# Patient Record
Sex: Female | Born: 1960 | Race: Black or African American | Hispanic: No | Marital: Single | Smoking: Never smoker
Health system: Southern US, Community
[De-identification: ages and names within clinical notes are randomized; demographics above are authoritative.]

## PROBLEM LIST (undated history)

## (undated) DIAGNOSIS — M549 Dorsalgia, unspecified: Secondary | ICD-10-CM

## (undated) DIAGNOSIS — M542 Cervicalgia: Secondary | ICD-10-CM

## (undated) DIAGNOSIS — I10 Essential (primary) hypertension: Secondary | ICD-10-CM

## (undated) DIAGNOSIS — E079 Disorder of thyroid, unspecified: Secondary | ICD-10-CM

## (undated) HISTORY — PX: NECK SURGERY: SHX720

## (undated) HISTORY — PX: ABDOMINAL HYSTERECTOMY: SHX81

## (undated) HISTORY — PX: ANKLE SURGERY: SHX546

## (undated) HISTORY — PX: KNEE SURGERY: SHX244

---

## 1998-03-20 ENCOUNTER — Encounter: Admission: RE | Admit: 1998-03-20 | Discharge: 1998-03-20 | Payer: Self-pay | Admitting: *Deleted

## 1998-04-01 ENCOUNTER — Inpatient Hospital Stay (HOSPITAL_COMMUNITY): Admission: AD | Admit: 1998-04-01 | Discharge: 1998-04-06 | Payer: Self-pay | Admitting: Obstetrics and Gynecology

## 1998-04-01 ENCOUNTER — Other Ambulatory Visit: Admission: RE | Admit: 1998-04-01 | Discharge: 1998-04-01 | Payer: Self-pay | Admitting: Obstetrics and Gynecology

## 1998-04-18 ENCOUNTER — Encounter (HOSPITAL_COMMUNITY): Admission: RE | Admit: 1998-04-18 | Discharge: 1998-07-17 | Payer: Self-pay | Admitting: Obstetrics and Gynecology

## 1998-11-25 ENCOUNTER — Emergency Department (HOSPITAL_COMMUNITY): Admission: EM | Admit: 1998-11-25 | Discharge: 1998-11-25 | Payer: Self-pay

## 1999-07-22 ENCOUNTER — Emergency Department (HOSPITAL_COMMUNITY): Admission: EM | Admit: 1999-07-22 | Discharge: 1999-07-22 | Payer: Self-pay | Admitting: Emergency Medicine

## 2004-10-30 ENCOUNTER — Other Ambulatory Visit: Admission: RE | Admit: 2004-10-30 | Discharge: 2004-10-30 | Payer: Self-pay | Admitting: Obstetrics and Gynecology

## 2006-11-23 ENCOUNTER — Other Ambulatory Visit: Admission: RE | Admit: 2006-11-23 | Discharge: 2006-11-23 | Payer: Self-pay | Admitting: Obstetrics & Gynecology

## 2010-01-13 ENCOUNTER — Ambulatory Visit (HOSPITAL_COMMUNITY): Admission: RE | Admit: 2010-01-13 | Discharge: 2010-01-13 | Payer: Self-pay | Admitting: Orthopaedic Surgery

## 2010-01-13 ENCOUNTER — Ambulatory Visit: Payer: Self-pay | Admitting: Surgery

## 2010-01-13 ENCOUNTER — Encounter (INDEPENDENT_AMBULATORY_CARE_PROVIDER_SITE_OTHER): Payer: Self-pay | Admitting: Orthopaedic Surgery

## 2010-02-20 ENCOUNTER — Ambulatory Visit: Payer: Self-pay | Admitting: Diagnostic Radiology

## 2010-02-20 ENCOUNTER — Emergency Department (HOSPITAL_BASED_OUTPATIENT_CLINIC_OR_DEPARTMENT_OTHER): Admission: EM | Admit: 2010-02-20 | Discharge: 2010-02-20 | Payer: Self-pay | Admitting: Emergency Medicine

## 2010-03-17 ENCOUNTER — Ambulatory Visit: Payer: Self-pay | Admitting: Diagnostic Radiology

## 2010-03-17 ENCOUNTER — Emergency Department (HOSPITAL_BASED_OUTPATIENT_CLINIC_OR_DEPARTMENT_OTHER): Admission: EM | Admit: 2010-03-17 | Discharge: 2010-03-17 | Payer: Self-pay | Admitting: Emergency Medicine

## 2010-04-17 ENCOUNTER — Emergency Department (HOSPITAL_BASED_OUTPATIENT_CLINIC_OR_DEPARTMENT_OTHER): Admission: EM | Admit: 2010-04-17 | Discharge: 2010-04-17 | Payer: Self-pay | Admitting: Emergency Medicine

## 2010-04-18 ENCOUNTER — Ambulatory Visit: Payer: Self-pay | Admitting: Vascular Surgery

## 2010-04-18 ENCOUNTER — Encounter (INDEPENDENT_AMBULATORY_CARE_PROVIDER_SITE_OTHER): Payer: Self-pay | Admitting: Emergency Medicine

## 2010-04-18 ENCOUNTER — Ambulatory Visit (HOSPITAL_COMMUNITY): Admission: RE | Admit: 2010-04-18 | Discharge: 2010-04-18 | Payer: Self-pay | Admitting: Emergency Medicine

## 2010-04-22 ENCOUNTER — Ambulatory Visit: Payer: Self-pay | Admitting: Hematology & Oncology

## 2010-04-28 LAB — CBC WITH DIFFERENTIAL (CANCER CENTER ONLY)
EOS%: 1.1 % (ref 0.0–7.0)
HCT: 38.5 % (ref 34.8–46.6)
HGB: 13.1 g/dL (ref 11.6–15.9)
LYMPH#: 3.4 10*3/uL — ABNORMAL HIGH (ref 0.9–3.3)
NEUT#: 4.7 10*3/uL (ref 1.5–6.5)
RDW: 12.7 % (ref 10.5–14.6)
WBC: 8.7 10*3/uL (ref 3.9–10.0)

## 2010-04-28 LAB — TECHNOLOGIST REVIEW CHCC SATELLITE

## 2010-04-28 LAB — PROTIME-INR (CHCC SATELLITE): Protime: 18 Seconds — ABNORMAL HIGH (ref 10.6–13.4)

## 2010-04-30 LAB — LUPUS ANTICOAGULANT PANEL
DRVVT 1:1 Mix: 42.4 secs (ref 36.2–44.3)
DRVVT: 54 secs — ABNORMAL HIGH (ref 36.2–44.3)
PTT Lupus Anticoagulant: 46.2 secs — ABNORMAL HIGH (ref 30.0–45.6)
PTTLA 4:1 Mix: 41.5 secs (ref 30.0–45.6)

## 2010-04-30 LAB — CARDIOLIPIN ANTIBODIES, IGG, IGM, IGA: Anticardiolipin IgA: 8 APL U/mL (ref <22)

## 2010-11-21 LAB — PROTIME-INR
INR: 1.4 (ref 0.00–1.49)
Prothrombin Time: 17.4 seconds — ABNORMAL HIGH (ref 11.6–15.2)

## 2010-11-23 LAB — CBC
Hemoglobin: 13.1 g/dL (ref 12.0–15.0)
Hemoglobin: 12.8 g/dL (ref 12.0–15.0)
MCV: 86.8 fL (ref 78.0–100.0)
RBC: 4.6 MIL/uL (ref 3.87–5.11)
RBC: 4.41 MIL/uL (ref 3.87–5.11)
RDW: 13.7 % (ref 11.5–15.5)

## 2010-11-23 LAB — DIFFERENTIAL
Basophils Relative: 0 % (ref 0–1)
Eosinophils Absolute: 0.1 10*3/uL (ref 0.0–0.7)
Eosinophils Relative: 1 % (ref 0–5)
Lymphocytes Relative: 37 % (ref 12–46)
Lymphocytes Relative: 34 % (ref 12–46)
Lymphs Abs: 2.7 10*3/uL (ref 0.7–4.0)
Monocytes Absolute: 0.6 10*3/uL (ref 0.1–1.0)
Monocytes Relative: 9 % (ref 3–12)

## 2010-11-23 LAB — POCT CARDIAC MARKERS
CKMB, poc: <1 ng/mL — ABNORMAL LOW (ref 1.0–8.0)
Myoglobin, poc: 49.2 ng/mL (ref 12–200)
Myoglobin, poc: 35.3 ng/mL (ref 12–200)
Troponin i, poc: <0.05 ng/mL (ref 0.00–0.09)

## 2010-11-23 LAB — GC/CHLAMYDIA PROBE AMP, GENITAL: Chlamydia, DNA Probe: NEGATIVE

## 2010-11-23 LAB — COMPREHENSIVE METABOLIC PANEL
ALT: 13 U/L (ref 0–35)
Albumin: 3.6 g/dL (ref 3.5–5.2)
Alkaline Phosphatase: 86 U/L (ref 39–117)
Creatinine, Ser: 0.9 mg/dL (ref 0.4–1.2)
GFR calc non Af Amer: >60 mL/min (ref >60)

## 2010-11-23 LAB — BASIC METABOLIC PANEL
Chloride: 108 mEq/L (ref 96–112)
GFR calc Af Amer: >60 mL/min (ref >60)

## 2010-11-23 LAB — WET PREP, GENITAL: Trich, Wet Prep: NONE SEEN

## 2010-11-23 LAB — LIPASE, BLOOD: Lipase: 34 U/L (ref 23–300)

## 2010-11-23 LAB — PROTIME-INR
INR: 1.99 — ABNORMAL HIGH (ref 0.00–1.49)
INR: 0.98 (ref 0.00–1.49)
Prothrombin Time: 22.4 seconds — ABNORMAL HIGH (ref 11.6–15.2)
Prothrombin Time: 12.9 seconds (ref 11.6–15.2)

## 2014-06-20 ENCOUNTER — Emergency Department (HOSPITAL_BASED_OUTPATIENT_CLINIC_OR_DEPARTMENT_OTHER)
Admission: EM | Admit: 2014-06-20 | Discharge: 2014-06-20 | Disposition: A | Discharge status: Home / Self Care | Payer: 59 | Attending: Emergency Medicine | Admitting: Emergency Medicine

## 2014-06-20 ENCOUNTER — Encounter (HOSPITAL_BASED_OUTPATIENT_CLINIC_OR_DEPARTMENT_OTHER): Payer: Self-pay | Admitting: Emergency Medicine

## 2014-06-20 DIAGNOSIS — Z79899 Other long term (current) drug therapy: Secondary | ICD-10-CM | POA: Diagnosis not present

## 2014-06-20 DIAGNOSIS — M2550 Pain in unspecified joint: Secondary | ICD-10-CM | POA: Insufficient documentation

## 2014-06-20 DIAGNOSIS — I10 Essential (primary) hypertension: Secondary | ICD-10-CM | POA: Insufficient documentation

## 2014-06-20 DIAGNOSIS — M546 Pain in thoracic spine: Secondary | ICD-10-CM | POA: Diagnosis not present

## 2014-06-20 DIAGNOSIS — E079 Disorder of thyroid, unspecified: Secondary | ICD-10-CM | POA: Diagnosis not present

## 2014-06-20 DIAGNOSIS — M542 Cervicalgia: Secondary | ICD-10-CM | POA: Insufficient documentation

## 2014-06-20 HISTORY — DX: Dorsalgia, unspecified: M54.9

## 2014-06-20 HISTORY — DX: Essential (primary) hypertension: I10

## 2014-06-20 HISTORY — DX: Disorder of thyroid, unspecified: E07.9

## 2014-06-20 HISTORY — DX: Cervicalgia: M54.2

## 2014-06-20 MED ORDER — NAPROXEN 500 MG PO TABS: 500.0000 mg | ORAL_TABLET | Freq: Two times a day (BID) | ORAL | Status: DC

## 2014-06-20 MED ORDER — METHOCARBAMOL 500 MG PO TABS: 500.0000 mg | ORAL_TABLET | Freq: Two times a day (BID) | ORAL | Status: DC

## 2014-06-20 MED ORDER — HYDROCODONE-ACETAMINOPHEN 5-325 MG PO TABS: 2.0000 | ORAL_TABLET | ORAL | Status: DC | PRN

## 2014-06-20 NOTE — Discharge Instructions (Signed)
Use medications as prescribed.  Call Dr. Darrelyn HillockGioffre for recheck appointment.  Return to ER, or your PCP with any loss of strength in Lt arm, or with any weakness/loss of feeling in right arm or either leg.

## 2014-06-20 NOTE — ED Notes (Signed)
Fell at work Dec 2014-c/o increase in pain to back of neck and left shoulder and arm-steady gait into triage

## 2014-06-20 NOTE — ED Provider Notes (Signed)
CSN: [id number]     Arrival date & time 06/20/14  1445 History   First MD Initiated Contact with Patient 06/20/14 1517     Chief Complaint  Patient presents with  . Neck Pain      HPI  Patient presents for evaluation of neck and back pain. Relates her symptoms to follow work in 2014. [facility]ioffre [facility] had an MRI. Was told that it did not require surgery. Continues to have episodes of pain at work. 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 No weakness to the arm. Simple pain and neck, radiates to the head and an arm. States his "the whole arm and all the fingers". No gait problems. No lower extremity symptoms. No right extremity symptoms.  Past Medical History  Diagnosis Date  . Back pain   . Neck pain   . Hypertension   . Thyroid disease    Past Surgical History  Procedure Laterality Date  . Abdominal hysterectomy    . Ankle surgery     No family history on file. History  Substance Use Topics  . Smoking status: Never Smoker   . Smokeless tobacco: Not on file  . Alcohol Use: No   OB History   Grav Para Term Preterm Abortions TAB SAB Ect Mult Living                 Review of Systems  Constitutional: Negative for fever, chills, diaphoresis, appetite change and fatigue.  HENT: Negative for mouth sores, sore throat and trouble swallowing.   Eyes: Negative for visual disturbance.  Respiratory: Negative for cough, chest tightness, shortness of breath and wheezing.   Cardiovascular: Negative for chest pain.  Gastrointestinal: Negative for nausea, vomiting, abdominal pain, diarrhea and abdominal distention.  Endocrine: Negative for polydipsia, polyphagia and polyuria.  Genitourinary: Negative for dysuria, frequency and hematuria.  Musculoskeletal: Positive for arthralgias, back pain, myalgias and neck pain. Negative for gait problem.  Skin: Negative for color change, pallor and rash.  Neurological: Negative for dizziness, syncope, light-headedness  and headaches.  Hematological: Does not bruise/bleed easily.  Psychiatric/Behavioral: Negative for behavioral problems and confusion.      Allergies  Review of patient's allergies indicates no known allergies.  Home Medications   Prior to Admission medications   Medication Sig Start Date End Date Taking? Authorizing Provider  Levothyroxine Sodium (SYNTHROID PO) Take by mouth.   Yes Historical Provider, MD  TRAMADOL HCL PO Take by mouth.   Yes Historical Provider, MD  Valsartan (DIOVAN PO) Take by mouth.   Yes Historical Provider, MD  HYDROcodone-acetaminophen (NORCO/VICODIN) 5-325 MG per tablet Take 2 tablets by mouth every 4 (four) hours as needed. 06/20/14   Rolland PorterMark Merrilee Ancona, MD  methocarbamol (ROBAXIN) 500 MG tablet Take 1 tablet (500 mg total) by mouth 2 (two) times daily. 06/20/14   Rolland PorterMark Eadie Repetto, MD  naproxen (NAPROSYN) 500 MG tablet Take 1 tablet (500 mg total) by mouth 2 (two) times daily. 06/20/14   Rolland PorterMark Breland Elders, MD   BP 180/109  Pulse 86  Temp(Src) 98.1 F (36.7 C) (Oral)  Resp 16  Ht 5\' 7"  (1.702 m)  Wt 290 lb (131.543 kg)  BMI 45.41 kg/m2  SpO2 100% Physical Exam  Constitutional: She is oriented to person, place, and time. She appears well-developed and well-nourished. No distress.  HENT:  Head: Normocephalic.    Eyes: Conjunctivae are normal. Pupils are equal, round, and reactive to light. No scleral icterus.  Neck: Normal range of motion. Neck supple. No  thyromegaly present.  Cardiovascular: Normal rate and regular rhythm.  Exam reveals no gallop and no friction rub.   No murmur heard. Pulmonary/Chest: Effort normal and breath sounds normal. No respiratory distress. She has no wheezes. She has no rales.  Abdominal: Soft. Bowel sounds are normal. She exhibits no distension. There is no tenderness. There is no rebound.  Musculoskeletal: Normal range of motion.       Back:  Neurological: She is alert and oriented to person, place, and time.  Normal symmetric Strength to  shoulder shrug, triceps, biceps, grip,wrist flex/extend,and intrinsics  Norma lsymmetric sensation above and below clavicles, and to all distributions to UEs. Norma symmetric strength to flex/.extend hip and knees, dorsi/plantar flex ankles. Normal symmetric sensation to all distributions to LEs Patellar and achilles reflexes 1-2+. Downgoing Babinski   Skin: Skin is warm and dry. No rash noted.  Psychiatric: She has a normal mood and affect. Her behavior is normal.    ED Course  Procedures (including critical care time) Labs Review Labs Reviewed - No data to display  Imaging Review No results found.   EKG Interpretation None      MDM   Final diagnoses:  Neck pain    No symptoms, or physical exam findings that would indicate acute herniated nucleus. Plan is back to her primary care physician or orthopedist. Consideration for physical therapy. Return precautions given.    Rolland PorterMark Sharryn Belding, MD 06/20/14 [phone]

## 2014-08-10 ENCOUNTER — Emergency Department (HOSPITAL_BASED_OUTPATIENT_CLINIC_OR_DEPARTMENT_OTHER)
Admission: EM | Admit: 2014-08-10 | Discharge: 2014-08-10 | Disposition: A | Discharge status: Home / Self Care | Payer: 59 | Attending: Emergency Medicine | Admitting: Emergency Medicine

## 2014-08-10 ENCOUNTER — Encounter (HOSPITAL_BASED_OUTPATIENT_CLINIC_OR_DEPARTMENT_OTHER): Payer: Self-pay

## 2014-08-10 ENCOUNTER — Emergency Department (HOSPITAL_BASED_OUTPATIENT_CLINIC_OR_DEPARTMENT_OTHER): Payer: 59

## 2014-08-10 DIAGNOSIS — R51 Headache: Secondary | ICD-10-CM | POA: Insufficient documentation

## 2014-08-10 DIAGNOSIS — R05 Cough: Secondary | ICD-10-CM | POA: Diagnosis present

## 2014-08-10 DIAGNOSIS — Z791 Long term (current) use of non-steroidal anti-inflammatories (NSAID): Secondary | ICD-10-CM | POA: Insufficient documentation

## 2014-08-10 DIAGNOSIS — M549 Dorsalgia, unspecified: Secondary | ICD-10-CM | POA: Diagnosis not present

## 2014-08-10 DIAGNOSIS — E079 Disorder of thyroid, unspecified: Secondary | ICD-10-CM | POA: Insufficient documentation

## 2014-08-10 DIAGNOSIS — J069 Acute upper respiratory infection, unspecified: Secondary | ICD-10-CM | POA: Insufficient documentation

## 2014-08-10 DIAGNOSIS — I10 Essential (primary) hypertension: Secondary | ICD-10-CM | POA: Diagnosis not present

## 2014-08-10 DIAGNOSIS — R059 Cough, unspecified: Secondary | ICD-10-CM

## 2014-08-10 DIAGNOSIS — Z79899 Other long term (current) drug therapy: Secondary | ICD-10-CM | POA: Diagnosis not present

## 2014-08-10 MED ORDER — DM-GUAIFENESIN ER 30-600 MG PO TB12: 1.0000 | ORAL_TABLET | Freq: Two times a day (BID) | ORAL | Status: DC

## 2014-08-10 MED ORDER — NAPROXEN 500 MG PO TABS: 500.0000 mg | ORAL_TABLET | Freq: Two times a day (BID) | ORAL | Status: DC

## 2014-08-10 NOTE — Discharge Instructions (Signed)
Rest and drink plenty of fluids. Take the Naprosyn as needed for her of body aches and soreness. Take Mucinex DM for the cough and phlegm. Today's chest x-ray negative for pneumonia. Work note provided for the next several days. Return for any new or worse symptoms.

## 2014-08-10 NOTE — ED Provider Notes (Signed)
CSN: [id number]     Arrival date & time 08/10/14  1734 History   This chart was scribed for Vanetta MuldersScott Nioma Mccubbins, MD by Abel PrestoKara Demonbreun, ED Scribe. This patient was seen in room MH12/MH12 and the patient's care was started at 7:01 PM.     Chief Complaint  Patient presents with  . Cough    The history is provided by the patient. No language interpreter was used.    HPI Comments: Jeanette Dodson is a 53 y.o. female who presents to the Emergency Department complaining of a productive cough with onset 2 days ago.  Pt notes associated congestion, rhinorrhea, chills, headache, sore throat, and ear pain.  Pt notes she has not taken her temperature but denies feeling febrile. Pt denies nausea, vomiting,  diarrhea, and rash. Pt states she has PMHx of back pain.  Pt sees Dr. Annie MainVaradarjan.    Past Medical History  Diagnosis Date  . Back pain   . Neck pain   . Hypertension   . Thyroid disease    Past Surgical History  Procedure Laterality Date  . Abdominal hysterectomy    . Ankle surgery     No family history on file. History  Substance Use Topics  . Smoking status: Never Smoker   . Smokeless tobacco: Not on file  . Alcohol Use: No   OB History    No data available     Review of Systems  Constitutional: Positive for chills. Negative for fever.  HENT: Positive for congestion, nosebleeds, rhinorrhea and sore throat.   Eyes: Negative for visual disturbance.  Respiratory: Positive for cough. Negative for shortness of breath.   Cardiovascular: Negative for chest pain and leg swelling.  Gastrointestinal: Negative for nausea, vomiting, abdominal pain and diarrhea.  Genitourinary: Negative for dysuria.  Musculoskeletal: Positive for back pain.  Skin: Negative for rash.  Neurological: Positive for headaches.  Hematological: Does not bruise/bleed easily.  Psychiatric/Behavioral: Negative for confusion.      Allergies  Review of patient's allergies indicates no known allergies.  Home  Medications   Prior to Admission medications   Medication Sig Start Date End Date Taking? Authorizing Provider  dextromethorphan-guaiFENesin (MUCINEX DM) 30-600 MG per 12 hr tablet Take 1 tablet by mouth 2 (two) times daily. 08/10/14   Vanetta MuldersScott Tadd Holtmeyer, MD  HYDROcodone-acetaminophen (NORCO/VICODIN) 5-325 MG per tablet Take 2 tablets by mouth every 4 (four) hours as needed. 06/20/14   Rolland PorterMark James, MD  Levothyroxine Sodium (SYNTHROID PO) Take by mouth.    Historical Provider, MD  methocarbamol (ROBAXIN) 500 MG tablet Take 1 tablet (500 mg total) by mouth 2 (two) times daily. 06/20/14   Rolland PorterMark James, MD  naproxen (NAPROSYN) 500 MG tablet Take 1 tablet (500 mg total) by mouth 2 (two) times daily. 08/10/14   Vanetta MuldersScott Gerrad Welker, MD  TRAMADOL HCL PO Take by mouth.    Historical Provider, MD  Valsartan (DIOVAN PO) Take by mouth.    Historical Provider, MD   BP 163/97 mmHg  Pulse 73  Temp(Src) 97.9 F (36.6 C) (Oral)  Resp 18  Ht 5\' 7"  (1.702 m)  Wt 260 lb (117.935 kg)  BMI 40.71 kg/m2  SpO2 100% Physical Exam  Constitutional: She is oriented to person, place, and time. She appears well-developed and well-nourished.  HENT:  Head: Normocephalic.  Right Ear: Tympanic membrane normal.  Left Ear: Tympanic membrane normal.  Mouth/Throat: Oropharynx is clear and moist. No oropharyngeal exudate.  Eyes: Conjunctivae and EOM are normal. Pupils are equal, round, and reactive  to light. No scleral icterus.  Neck: Normal range of motion. Neck supple.  Cardiovascular: Normal rate, regular rhythm and normal heart sounds.   No murmur heard. Pulmonary/Chest: Effort normal and breath sounds normal. No respiratory distress. She has no wheezes. She has no rales.  Abdominal: Soft. Bowel sounds are normal. There is no tenderness.  Musculoskeletal: Normal range of motion. She exhibits no edema.  Neurological: She is alert and oriented to person, place, and time. No cranial nerve deficit. She exhibits normal muscle tone.  Coordination normal.  Skin: Skin is warm and dry.  Psychiatric: She has a normal mood and affect. Her behavior is normal.  Nursing note and vitals reviewed.   ED Course  Procedures (including critical care time) DIAGNOSTIC STUDIES: Oxygen Saturation is 100% on room air, normal by my interpretation.    COORDINATION OF CARE: 7:10 PM Discussed treatment plan with patient at beside, the patient agrees with the plan and has no further questions at this time.   Labs Review Labs Reviewed - No data to display  Imaging Review Dg Chest 2 View  08/10/2014   CLINICAL DATA:  Initial evaluation for productive cough for 2 days with chills headaches sore throat and ear pain  EXAM: CHEST  2 VIEW  COMPARISON:  None.  FINDINGS: The heart size and mediastinal contours are within normal limits. Both lungs are clear. The visualized skeletal structures are unremarkable.  IMPRESSION: No active cardiopulmonary disease.   Electronically Signed   By: Esperanza Heiraymond  Rubner M.D.   On: 08/10/2014 20:08     EKG Interpretation None      MDM   Final diagnoses:  Cough  URI (upper respiratory infection)   Patient nontoxic no acute distress. No hypoxia. Room air saturations 100%. Chest x-ray negative for pneumonia. Symptoms consistent with flulike illness or upper respiratory infection. Will treat with Mucinex DM and Naprosyn. Work note provided.    I personally performed the services described in this documentation, which was scribed in my presence. The recorded information has been reviewed and is accurate.       Vanetta MuldersScott Cecily Lawhorne, MD 08/10/14 2040

## 2014-08-10 NOTE — ED Notes (Signed)
Pt reports cough, congestion, postnasal drip and sore throat.  Denies fever but reports chills.

## 2014-08-20 ENCOUNTER — Emergency Department (HOSPITAL_COMMUNITY)
Admission: EM | Admit: 2014-08-20 | Discharge: 2014-08-20 | Disposition: A | Discharge status: Home / Self Care | Payer: 59 | Attending: Emergency Medicine | Admitting: Emergency Medicine

## 2014-08-20 ENCOUNTER — Encounter (HOSPITAL_COMMUNITY): Payer: Self-pay | Admitting: Emergency Medicine

## 2014-08-20 DIAGNOSIS — I10 Essential (primary) hypertension: Secondary | ICD-10-CM | POA: Insufficient documentation

## 2014-08-20 DIAGNOSIS — G8929 Other chronic pain: Secondary | ICD-10-CM | POA: Diagnosis not present

## 2014-08-20 DIAGNOSIS — M5431 Sciatica, right side: Secondary | ICD-10-CM | POA: Diagnosis not present

## 2014-08-20 DIAGNOSIS — M545 Low back pain: Secondary | ICD-10-CM | POA: Diagnosis present

## 2014-08-20 DIAGNOSIS — Z79899 Other long term (current) drug therapy: Secondary | ICD-10-CM | POA: Diagnosis not present

## 2014-08-20 DIAGNOSIS — E079 Disorder of thyroid, unspecified: Secondary | ICD-10-CM | POA: Diagnosis not present

## 2014-08-20 DIAGNOSIS — Z791 Long term (current) use of non-steroidal anti-inflammatories (NSAID): Secondary | ICD-10-CM | POA: Diagnosis not present

## 2014-08-20 DIAGNOSIS — Z9181 History of falling: Secondary | ICD-10-CM | POA: Diagnosis not present

## 2014-08-20 MED ORDER — IBUPROFEN 600 MG PO TABS: 600.0000 mg | ORAL_TABLET | Freq: Four times a day (QID) | ORAL | Status: DC | PRN

## 2014-08-20 MED ORDER — CYCLOBENZAPRINE HCL 10 MG PO TABS: 10.0000 mg | ORAL_TABLET | Freq: Two times a day (BID) | ORAL | Status: DC | PRN

## 2014-08-20 NOTE — ED Notes (Signed)
Patient states had a fall at work last year and had been [facility] for same until 2 weeks ago when she was referred to pain management.   Patient states is supposed to have injections on Wed by pain management, but has started having pain that radiates down leg at this time.   Patient did not call [facility].

## 2014-08-20 NOTE — ED Notes (Signed)
Pts vital signs updated pt awaiting discharge paperwork at bedside.  

## 2014-08-20 NOTE — Discharge Instructions (Signed)
Radicular Pain °Radicular pain in either the arm or leg is usually from a bulging or herniated disk in the spine. A piece of the herniated disk may press against the nerves as the nerves exit the spine. This causes pain which is felt at the tips of the nerves down the arm or leg. Other causes of radicular pain may include: °· Fractures. °· Heart disease. °· Cancer. °· An abnormal and usually degenerative state of the nervous system or nerves (neuropathy). °Diagnosis may require CT or MRI scanning to determine the primary cause.  °Nerves that start at the neck (nerve roots) may cause radicular pain in the outer shoulder and arm. It can spread down to the thumb and fingers. The symptoms vary depending on which nerve root has been affected. In most cases radicular pain improves with conservative treatment. Neck problems may require physical therapy, a neck collar, or cervical traction. Treatment may take many weeks, and surgery may be considered if the symptoms do not improve.  °Conservative treatment is also recommended for sciatica. Sciatica causes pain to radiate from the lower back or buttock area down the leg into the foot. Often there is a history of back problems. Most patients with sciatica are better after 2 to 4 weeks of rest and other supportive care. Short term bed rest can reduce the disk pressure considerably. Sitting, however, is not a good position since this increases the pressure on the disk. You should avoid bending, lifting, and all other activities which make the problem worse. Traction can be used in severe cases. Surgery is usually reserved for patients who do not improve within the first months of treatment. °Only take over-the-counter or prescription medicines for pain, discomfort, or fever as directed by your caregiver. Narcotics and muscle relaxants may help by relieving more severe pain and spasm and by providing mild sedation. Cold or massage can give significant relief. Spinal manipulation  is not recommended. It can increase the degree of disc protrusion. Epidural steroid injections are often effective treatment for radicular pain. These injections deliver medicine to the spinal nerve in the space between the protective covering of the spinal cord and back bones (vertebrae). Your caregiver can give you more information about steroid injections. These injections are most effective when given within two weeks of the onset of pain.  °You should see your caregiver for follow up care as recommended. A program for neck and back injury rehabilitation with stretching and strengthening exercises is an important part of management.  °SEEK IMMEDIATE MEDICAL CARE IF: °· You develop increased pain, weakness, or numbness in your arm or leg. °· You develop difficulty with bladder or bowel control. °· You develop abdominal pain. °Document Released: 10/01/2004 Document Revised: 11/16/2011 Document Reviewed: 12/17/2008 °ExitCare® Patient Information ©2015 [employer]. This information is not intended to replace advice given to you by your health care provider. Make sure you discuss any questions you have with your health care provider. ° °Back Exercises °Back exercises help treat and prevent back injuries. The goal of back exercises is to increase the strength of your abdominal and back muscles and the flexibility of your back. These exercises should be started when you no longer have back pain. Back exercises include: °· Pelvic Tilt. Lie on your back with your knees bent. Tilt your pelvis until the lower part of your back is against the floor. Hold this position 5 to 10 sec and repeat 5 to 10 times. °· Knee to Chest. Pull first 1 knee up   against your chest and hold for 20 to 30 seconds, repeat this with the other knee, and then both knees. This may be done with the other leg straight or bent, whichever feels better. °· Sit-Ups or Curl-Ups. Bend your knees 90 degrees. Start with tilting your pelvis, and do a partial,  slow sit-up, lifting your trunk only 30 to 45 degrees off the floor. Take at least 2 to 3 seconds for each sit-up. Do not do sit-ups with your knees out straight. If partial sit-ups are difficult, simply do the above but with only tightening your abdominal muscles and holding it as directed. °· Hip-Lift. Lie on your back with your knees flexed 90 degrees. Push down with your feet and shoulders as you raise your hips a couple inches off the floor; hold for 10 seconds, repeat 5 to 10 times. °· Back arches. Lie on your stomach, propping yourself up on bent elbows. Slowly press on your hands, causing an arch in your low back. Repeat 3 to 5 times. Any initial stiffness and discomfort should lessen with repetition over time. °· Shoulder-Lifts. Lie face down with arms beside your body. Keep hips and torso pressed to floor as you slowly lift your head and shoulders off the floor. °Do not overdo your exercises, especially in the beginning. Exercises may cause you some mild back discomfort which lasts for a few minutes; however, if the pain is more severe, or lasts for more than 15 minutes, do not continue exercises until you see your caregiver. Improvement with exercise therapy for back problems is slow.  °See your caregivers for assistance with developing a proper back exercise program. °Document Released: 10/01/2004 Document Revised: 11/16/2011 Document Reviewed: 06/25/2011 °ExitCare® Patient Information ©2015 [employer]. This information is not intended to replace advice given to you by your health care provider. Make sure you discuss any questions you have with your health care provider. ° °

## 2014-08-20 NOTE — ED Provider Notes (Signed)
CSN: [id number]     Arrival date & time 08/20/14  0929 History  This chart was scribed for non-physician practitioner Jinny SandersJoseph Audine Mangione, PA-C, working with Vida RollerBrian D Miller, MD by Littie Deedsichard Sun, ED Scribe. This patient was seen in room TR08C/TR08C and the patient's care was started at 11:30 AM.     Chief Complaint  Patient presents with  . Back Pain    The history is provided by the patient. No language interpreter was used.   HPI Comments: Jeanette Dodson is a 53 y.o. female with a hx of back pain and a fall last year who presents to the Emergency Department complaining of lower back pain radiating to her right leg that began yesterday afternoon. She has difficulty lifting her right leg and difficulty ambulating due to pain, however denies weakness in her leg. Patient has tried Tylenol and Aleve but with no relief; she has not tried any muscle relaxer. She denies hx of CA or IV drug use. Patient is able to control her bowel and bladder. Patient denies saddle anesthesia. She is supposed to have injections in 2 days by pain management. She had been seeing [facility] for a fall at work last year since the fall up until 2 weeks ago, when she was referred to pain management. Patient states her pain today is consistent with her chronic pain, however she has not experienced the severity of pain that radiates down her leg in the past.  Past Medical History  Diagnosis Date  . Back pain   . Neck pain   . Hypertension   . Thyroid disease    Past Surgical History  Procedure Laterality Date  . Abdominal hysterectomy    . Ankle surgery     No family history on file. History  Substance Use Topics  . Smoking status: Never Smoker   . Smokeless tobacco: Not on file  . Alcohol Use: No   OB History    No data available     Review of Systems  Genitourinary: Negative for dysuria, urgency, frequency, hematuria, decreased urine volume and difficulty urinating.  Musculoskeletal: Positive for myalgias and  back pain.      Allergies  Review of patient's allergies indicates no known allergies.  Home Medications   Prior to Admission medications   Medication Sig Start Date End Date Taking? Authorizing Provider  cyclobenzaprine (FLEXERIL) 10 MG tablet Take 1 tablet (10 mg total) by mouth 2 (two) times daily as needed for muscle spasms. 08/20/14   Monte FantasiaJoseph W Lavin Petteway, PA-C  dextromethorphan-guaiFENesin [facility](MUCINEX DM) 30-600 MG per 12 hr tablet Take 1 tablet by mouth 2 (two) times daily. 08/10/14   Vanetta MuldersScott Zackowski, MD  HYDROcodone-acetaminophen (NORCO/VICODIN) 5-325 MG per tablet Take 2 tablets by mouth every 4 (four) hours as needed. 06/20/14   Rolland PorterMark James, MD  ibuprofen (ADVIL,MOTRIN) 600 MG tablet Take 1 tablet (600 mg total) by mouth every 6 (six) hours as needed. 08/20/14   Monte FantasiaJoseph W Cline Draheim, PA-C  Levothyroxine Sodium (SYNTHROID PO) Take by mouth.    Historical Provider, MD  methocarbamol (ROBAXIN) 500 MG tablet Take 1 tablet (500 mg total) by mouth 2 (two) times daily. 06/20/14   Rolland PorterMark James, MD  naproxen (NAPROSYN) 500 MG tablet Take 1 tablet (500 mg total) by mouth 2 (two) times daily. 08/10/14   Vanetta MuldersScott Zackowski, MD  TRAMADOL HCL PO Take by mouth.    Historical Provider, MD  Valsartan (DIOVAN PO) Take by mouth.    Historical Provider, MD   BP  150/99 mmHg  Pulse 73  Temp(Src) 98.2 F (36.8 C) (Oral)  Resp 18  SpO2 99% Physical Exam  Constitutional: She is oriented to person, place, and time. She appears well-developed and well-nourished. No distress.  HENT:  Head: Normocephalic and atraumatic.  Mouth/Throat: Oropharynx is clear and moist. No oropharyngeal exudate.  Eyes: Pupils are equal, round, and reactive to light.  Neck: Neck supple.  Cardiovascular: Normal rate.   Pulmonary/Chest: Effort normal.  Musculoskeletal: She exhibits tenderness. She exhibits no edema.  Pain in right SI joint with palpation. Pain in L3-S1 region to palpation of spinous and paraspinous processes.  Neurological:  She is alert and oriented to person, place, and time. She has normal strength. No cranial nerve deficit or sensory deficit. She displays a negative Romberg sign. Gait normal. GCS eye subscore is 4. GCS verbal subscore is 5. GCS motor subscore is 6.  Patient fully alert answering questions appropriately in full, clear sentences. Motor strength 5 out of 5 in all major muscle groups of upper and lower extremities. Cranial nerves II through XII grossly intact. Distal sensation intact. Gait assessed and normal.  Skin: Skin is warm and dry. No rash noted.  Psychiatric: She has a normal mood and affect. Her behavior is normal.  Nursing note and vitals reviewed.   ED Course  Procedures  DIAGNOSTIC STUDIES: Oxygen Saturation is 98% on room air, normal by my interpretation.    COORDINATION OF CARE: 11:36 AM-Discussed treatment plan which includes muscle relaxer and pain medication with pt at bedside and pt agreed to plan.    Labs Review Labs Reviewed - No data to display  Imaging Review No results found.   EKG Interpretation None      MDM   Final diagnoses:  Sciatic nerve pain, right   Patient with back pain consistent with chronic pain with no new or concerning symptoms.  No neurological deficits and normal neuro exam.  Patient can walk but states is painful.  No loss of bowel or bladder control.  No concern for cauda equina.  No fever, night sweats, weight loss, h/o cancer, IVDU.  RICE protocol and pain medicine indicated and discussed with patient. Patient states she is following up with pain clinic in 2 days. I will prescribe conservative therapy for patient, recommending use of back exercises, heating pad, Motrin, Flexeril to avoid altering therapy plan of patient's pain management clinic. I discussed return precautions with patient and she was agreeable to this plan. I encouraged her to call or return to ER should she have any questions or concerns.  I personally performed the services  described in this documentation, which was scribed in my presence. The recorded information has been reviewed and is accurate.  BP 150/99 mmHg  Pulse 73  Temp(Src) 98.2 F (36.8 C) (Oral)  Resp 18  SpO2 99%  Signed,  Ladona MowJoe Bali Lyn, PA-C 6:30 PM     Monte FantasiaJoseph W Agata Lucente, PA-C 08/20/14 1832  Vida RollerBrian D Miller, MD 08/21/14 1600

## 2015-02-22 ENCOUNTER — Emergency Department (HOSPITAL_BASED_OUTPATIENT_CLINIC_OR_DEPARTMENT_OTHER)
Admission: EM | Admit: 2015-02-22 | Discharge: 2015-02-22 | Disposition: A | Discharge status: Home / Self Care | Payer: 59 | Attending: Emergency Medicine | Admitting: Emergency Medicine

## 2015-02-22 ENCOUNTER — Encounter (HOSPITAL_BASED_OUTPATIENT_CLINIC_OR_DEPARTMENT_OTHER): Payer: Self-pay

## 2015-02-22 ENCOUNTER — Emergency Department (HOSPITAL_BASED_OUTPATIENT_CLINIC_OR_DEPARTMENT_OTHER): Payer: 59

## 2015-02-22 DIAGNOSIS — X58XXXA Exposure to other specified factors, initial encounter: Secondary | ICD-10-CM | POA: Insufficient documentation

## 2015-02-22 DIAGNOSIS — Z79899 Other long term (current) drug therapy: Secondary | ICD-10-CM | POA: Insufficient documentation

## 2015-02-22 DIAGNOSIS — Y9289 Other specified places as the place of occurrence of the external cause: Secondary | ICD-10-CM | POA: Insufficient documentation

## 2015-02-22 DIAGNOSIS — Y998 Other external cause status: Secondary | ICD-10-CM | POA: Diagnosis not present

## 2015-02-22 DIAGNOSIS — Y9389 Activity, other specified: Secondary | ICD-10-CM | POA: Insufficient documentation

## 2015-02-22 DIAGNOSIS — S8992XA Unspecified injury of left lower leg, initial encounter: Secondary | ICD-10-CM | POA: Insufficient documentation

## 2015-02-22 DIAGNOSIS — I10 Essential (primary) hypertension: Secondary | ICD-10-CM | POA: Insufficient documentation

## 2015-02-22 DIAGNOSIS — M25562 Pain in left knee: Secondary | ICD-10-CM

## 2015-02-22 DIAGNOSIS — E079 Disorder of thyroid, unspecified: Secondary | ICD-10-CM | POA: Diagnosis not present

## 2015-02-22 DIAGNOSIS — E663 Overweight: Secondary | ICD-10-CM | POA: Insufficient documentation

## 2015-02-22 DIAGNOSIS — Z791 Long term (current) use of non-steroidal anti-inflammatories (NSAID): Secondary | ICD-10-CM | POA: Diagnosis not present

## 2015-02-22 MED ORDER — HYDROCODONE-ACETAMINOPHEN 5-325 MG PO TABS
1.0000 | ORAL_TABLET | Freq: Once | ORAL | Status: AC
  Administered 2015-02-22: 1 via ORAL
  Filled 2015-02-22: qty 1

## 2015-02-22 MED ORDER — HYDROCODONE-ACETAMINOPHEN 5-325 MG PO TABS: ORAL_TABLET | ORAL | Status: AC

## 2015-02-22 NOTE — ED Notes (Signed)
Patient transported to X-ray via stretcher per tech. 

## 2015-02-22 NOTE — ED Provider Notes (Signed)
CSN: [id number]     Arrival date & time 02/22/15  1847 History   First MD Initiated Contact with Patient 02/22/15 1850     Chief Complaint  Patient presents with  . Knee Pain     (Consider location/radiation/quality/duration/timing/severity/associated sxs/prior Treatment) HPI Comments: Patient with history of left knee arthroscopy presents with complaint of acute onset left knee pain. Patient has been having pain in her knee for the past 2 days. It started after she stood up from the toilet. Today she stood up from a sitting position again and felt a "pop" in the left knee. This was associated with severe pain. She did not fall. She denies numbness or tingling of the extremity. She is having difficulty walking on the knee because of pain. Pain is worse with movement. No treatments prior to arrival. Course is constant.  Patient is a 54 y.o. female presenting with knee pain. The history is provided by the patient.  Knee Pain Associated symptoms: no back pain and no neck pain     Past Medical History  Diagnosis Date  . Back pain   . Neck pain   . Hypertension   . Thyroid disease    Past Surgical History  Procedure Laterality Date  . Abdominal hysterectomy    . Ankle surgery     No family history on file. History  Substance Use Topics  . Smoking status: Never Smoker   . Smokeless tobacco: Not on file  . Alcohol Use: No   OB History    No data available     Review of Systems  Constitutional: Positive for activity change.  Musculoskeletal: Positive for arthralgias and gait problem. Negative for back pain, joint swelling and neck pain.  Skin: Negative for wound.  Neurological: Negative for weakness and numbness.      Allergies  Review of patient's allergies indicates no known allergies.  Home Medications   Prior to Admission medications   Medication Sig Start Date End Date Taking? Authorizing Provider  Vitamin D, Ergocalciferol, (DRISDOL) 50000 UNITS CAPS capsule Take  50,000 Units by mouth every 7 (seven) days.   Yes Historical Provider, MD  cyclobenzaprine (FLEXERIL) 10 MG tablet Take 1 tablet (10 mg total) by mouth 2 (two) times daily as needed for muscle spasms. 08/20/14   Ladona Mow, PA-C  dextromethorphan-guaiFENesin [facility] DM) 30-600 MG per 12 hr tablet Take 1 tablet by mouth 2 (two) times daily. 08/10/14   Vanetta Mulders, MD  HYDROcodone-acetaminophen (NORCO/VICODIN) 5-325 MG per tablet Take 2 tablets by mouth every 4 (four) hours as needed. 06/20/14   Rolland Porter, MD  ibuprofen (ADVIL,MOTRIN) 600 MG tablet Take 1 tablet (600 mg total) by mouth every 6 (six) hours as needed. 08/20/14   Ladona Mow, PA-C  Levothyroxine Sodium (SYNTHROID PO) Take by mouth.    Historical Provider, MD  methocarbamol (ROBAXIN) 500 MG tablet Take 1 tablet (500 mg total) by mouth 2 (two) times daily. 06/20/14   Rolland Porter, MD  naproxen (NAPROSYN) 500 MG tablet Take 1 tablet (500 mg total) by mouth 2 (two) times daily. 08/10/14   Vanetta Mulders, MD  TRAMADOL HCL PO Take by mouth.    Historical Provider, MD  Valsartan (DIOVAN PO) Take by mouth.    Historical Provider, MD   BP 167/115 mmHg  Pulse 86  Temp(Src) 98.1 F (36.7 C) (Oral)  Resp 21  Ht  (1.651 m)  Wt 261 lb (118.389 kg)  BMI 43.43 kg/m2  SpO2 99%   Physical  Exam  Constitutional: She appears well-developed and well-nourished.  HENT:  Head: Normocephalic and atraumatic.  Eyes: Pupils are equal, round, and reactive to light.  Neck: Normal range of motion. Neck supple.  Cardiovascular: Exam reveals no decreased pulses.   Musculoskeletal: She exhibits tenderness. She exhibits no edema.       Left hip: Normal.       Left knee: She exhibits decreased range of motion. She exhibits no swelling, no effusion and no bony tenderness. Tenderness found. Medial joint line and lateral joint line tenderness noted. No MCL, no LCL and no patellar tendon tenderness noted.       Left ankle: Normal.  Neurological: She is  alert. No sensory deficit.  Motor, sensation, and vascular distal to the injury is fully intact.   Skin: Skin is warm and dry.  Psychiatric: She has a normal mood and affect.  Nursing note and vitals reviewed.   ED Course  Procedures (including critical care time) Labs Review Labs Reviewed - No data to display  Imaging Review Dg Knee Complete 4 Views Left  02/22/2015   CLINICAL DATA:  Left knee popping sensation 2 days ago while standing  EXAM: LEFT KNEE - COMPLETE 4+ VIEW  COMPARISON:  Left knee MRI 09/17/2009.  Knee radiographs 06/20/2009.  FINDINGS: There is no evidence of fracture, dislocation, or joint effusion. There is no evidence of arthropathy or other focal bone abnormality. Soft tissues are unremarkable. Mild deformity of the proximal left fibula is reidentified, either congenital or due to remote trauma.  IMPRESSION: No acute osseous abnormality of the left knee.   Electronically Signed   By: Christiana Pellant M.D.   On: 02/22/2015 19:19     EKG Interpretation None       7:34 PM Patient seen and examined.   Vital signs reviewed and are as follows: BP 167/115 mmHg  Pulse 86  Temp(Src) 98.1 F (36.7 C) (Oral)  Resp 21  Ht 5\' 5"  (1.651 m)  Wt 261 lb (118.389 kg)  BMI 43.43 kg/m2  SpO2 99%  Patient informed of x-ray results. Discussed rice protocol. Will discharge to home with Ace wrap, pain medication.  Patient counseled on use of narcotic pain medications. Counseled not to combine these medications with others containing tylenol. Urged not to drink alcohol, drive, or perform any other activities that requires focus while taking these medications. The patient verbalizes understanding and agrees with the plan.  She is asked to follow-up with her PCP/orthopedist in one week if her pain is not improved.  MDM   Final diagnoses:  Knee pain, acute, left   Overweight patient with knee pain, she felt a pop today. X-rays are negative. Lower extremity is neurovascularly  intact. Conservative measures with follow-up indicated if not improved in one week.   Renne Crigler, PA-C 02/22/15 1943  Linwood Dibbles, MD 02/22/15 [phone]

## 2015-02-22 NOTE — ED Notes (Signed)
Pt reports 2 days ago stood and "felt something pop" in L knee.  Now with pain, worse on ambulation.

## 2015-02-22 NOTE — Discharge Instructions (Signed)
Please read and follow all provided instructions.  Your diagnoses today include:  1. Knee pain, acute, left     Tests performed today include:  An x-ray of the affected area - does NOT show any broken bones  Vital signs. See below for your results today.   Medications prescribed:   Vicodin (hydrocodone/acetaminophen) - narcotic pain medication  DO NOT drive or perform any activities that require you to be awake and alert because this medicine can make you drowsy. BE VERY CAREFUL not to take multiple medicines containing Tylenol (also called acetaminophen). Doing so can lead to an overdose which can damage your liver and cause liver failure and possibly death.  Take any prescribed medications only as directed.  Home care instructions:   Follow any educational materials contained in this packet  Follow R.I.C.E. Protocol:  R - rest your injury   I  - use ice on injury without applying directly to skin  C - compress injury with bandage or splint  E - elevate the injury as much as possible  Follow-up instructions: Please follow-up with your primary care provider or the provided orthopedic physician (bone specialist) if you continue to have significant pain in 1 week. In this case you may have a more severe injury that requires further care.   Return instructions:   Please return if your toes or feet are numb or tingling, appear gray or blue, or you have severe pain (also elevate the leg and loosen splint or wrap if you were given one)  Please return to the Emergency Department if you experience worsening symptoms.   Please return if you have any other emergent concerns.  Additional Information:  Your vital signs today were: BP 167/115 mmHg   Pulse 86   Temp(Src) 98.1 F (36.7 C) (Oral)   Resp 21   Ht 5\' 5"  (1.651 m)   Wt 261 lb (118.389 kg)   BMI 43.43 kg/m2   SpO2 99% If your blood pressure (BP) was elevated above 135/85 this visit, please have this repeated by your  doctor within one month. --------------

## 2018-11-08 ENCOUNTER — Other Ambulatory Visit: Payer: Self-pay

## 2018-11-08 ENCOUNTER — Emergency Department (HOSPITAL_BASED_OUTPATIENT_CLINIC_OR_DEPARTMENT_OTHER)
Admission: EM | Admit: 2018-11-08 | Discharge: 2018-11-08 | Disposition: A | Discharge status: Home / Self Care | Payer: 59 | Attending: Emergency Medicine | Admitting: Emergency Medicine

## 2018-11-08 ENCOUNTER — Encounter (HOSPITAL_BASED_OUTPATIENT_CLINIC_OR_DEPARTMENT_OTHER): Payer: Self-pay | Admitting: Emergency Medicine

## 2018-11-08 ENCOUNTER — Emergency Department (HOSPITAL_BASED_OUTPATIENT_CLINIC_OR_DEPARTMENT_OTHER): Payer: 59

## 2018-11-08 DIAGNOSIS — J069 Acute upper respiratory infection, unspecified: Secondary | ICD-10-CM

## 2018-11-08 DIAGNOSIS — M7121 Synovial cyst of popliteal space [Baker], right knee: Secondary | ICD-10-CM

## 2018-11-08 DIAGNOSIS — Z79899 Other long term (current) drug therapy: Secondary | ICD-10-CM | POA: Diagnosis not present

## 2018-11-08 DIAGNOSIS — R69 Illness, unspecified: Secondary | ICD-10-CM

## 2018-11-08 DIAGNOSIS — M791 Myalgia, unspecified site: Secondary | ICD-10-CM

## 2018-11-08 DIAGNOSIS — R51 Headache: Secondary | ICD-10-CM | POA: Diagnosis present

## 2018-11-08 DIAGNOSIS — E079 Disorder of thyroid, unspecified: Secondary | ICD-10-CM | POA: Diagnosis not present

## 2018-11-08 DIAGNOSIS — J111 Influenza due to unidentified influenza virus with other respiratory manifestations: Secondary | ICD-10-CM

## 2018-11-08 DIAGNOSIS — M79661 Pain in right lower leg: Secondary | ICD-10-CM | POA: Insufficient documentation

## 2018-11-08 LAB — GROUP A STREP BY PCR: Group A Strep by PCR: NOT DETECTED

## 2018-11-08 MED ORDER — OSELTAMIVIR PHOSPHATE 75 MG PO CAPS: 75.0000 mg | ORAL_CAPSULE | Freq: Two times a day (BID) | ORAL | 0 refills | Status: AC

## 2018-11-08 MED FILL — OSELTAMIVIR PHOSPHATE 75 MG: 75 | 5 days supply | Qty: 10 | Fill #0

## 2018-11-08 NOTE — Discharge Instructions (Signed)
Based on your history and exam and exposure to upper respiratory infections, we suspect he has influenza or a flulike illness.  As your symptoms get yesterday, we feel you are in the window to treat with Tamiflu.  Please take this prescription for the next 5 days.  Please push hydration and stay hydrated.  Please follow-up with your primary doctor.  Your ultrasound not show evidence of a DVT but did show the Baker's cyst.  Please follow-up with your PCP for this.  If any symptoms change or worsen, please return to the nearest emergency department.  Please rest.

## 2018-11-08 NOTE — ED Provider Notes (Signed)
[facility] EMERGENCY DEPARTMENT Provider Note   CSN: [id number] Arrival date & time: 11/08/18  5465    History   Chief Complaint Chief Complaint  Patient presents with  . Headache    HPI Jeanette Dodson is a 58 y.o. female.     The history is provided by the patient and medical records. No language interpreter was used.  URI  Presenting symptoms: congestion, cough, ear pain, fatigue, fever, rhinorrhea and sore throat   Severity:  Moderate Onset quality:  Gradual Duration:  2 days Timing:  Constant Progression:  Waxing and waning Chronicity:  New Relieved by:  Nothing Worsened by:  Nothing Associated symptoms: headaches and myalgias   Associated symptoms: no neck pain, no sinus pain, no sneezing and no wheezing   Risk factors: sick contacts   Risk factors: no recent travel     Past Medical History:  Diagnosis Date  . Back pain   . Hypertension   . Neck pain   . Thyroid disease     There are no active problems to display for this patient.   Past Surgical History:  Procedure Laterality Date  . ABDOMINAL HYSTERECTOMY    . ANKLE SURGERY       OB History   No obstetric history on file.      Home Medications    Prior to Admission medications   Medication Sig Start Date End Date Taking? Authorizing Provider  HYDROcodone-acetaminophen (NORCO/VICODIN) 5-325 MG per tablet Take 1-2 tablets every 6 hours as needed for severe pain 02/22/15   Renne Crigler, PA-C  Levothyroxine Sodium (SYNTHROID PO) Take by mouth.    [provider]  Valsartan (DIOVAN PO) Take by mouth.    [provider]  Vitamin D, Ergocalciferol, (DRISDOL) 50000 UNITS CAPS capsule Take 50,000 Units by mouth every 7 (seven) days.    [provider]    Family History History reviewed. No pertinent family history.  Social History Social History   Tobacco Use  . Smoking status: Never Smoker  . Smokeless tobacco: Never Used  Substance Use Topics  .  Alcohol use: No  . Drug use: No     Allergies   Patient has no known allergies.   Review of Systems Review of Systems  Constitutional: Positive for chills, fatigue and fever. Negative for diaphoresis.  HENT: Positive for congestion, ear pain, rhinorrhea and sore throat. Negative for dental problem, ear discharge, nosebleeds, sinus pain, sneezing, tinnitus and trouble swallowing.   Eyes: Negative for visual disturbance.  Respiratory: Positive for cough. Negative for chest tightness, shortness of breath and wheezing.   Cardiovascular: Negative for chest pain, palpitations and leg swelling.  Gastrointestinal: Positive for diarrhea and nausea. Negative for abdominal pain, constipation and vomiting.  Genitourinary: Negative for dysuria, flank pain and frequency.  Musculoskeletal: Positive for myalgias. Negative for back pain, neck pain and neck stiffness.  Skin: Negative for rash and wound.  Neurological: Positive for headaches. Negative for weakness and light-headedness.  Psychiatric/Behavioral: Negative for agitation.  All other systems reviewed and are negative.    Physical Exam Updated Vital Signs BP (!) 179/128 (BP Location: Left Arm)   Pulse 80   Temp 98.1 F (36.7 C) (Oral)   Resp 20   Ht 5\' 5"  (1.651 m)   Wt 86.6 kg   SpO2 97%   BMI 31.78 kg/m   Physical Exam Vitals signs and nursing note reviewed.  Constitutional:      General: She is not in acute distress.  Appearance: She is well-developed. She is not ill-appearing or diaphoretic.  HENT:     Head: Normocephalic and atraumatic.     Nose: Congestion and rhinorrhea present.     Mouth/Throat:     Mouth: Mucous membranes are moist.     Pharynx: Posterior oropharyngeal erythema present.     Tonsils: No tonsillar exudate or tonsillar abscesses.  Eyes:     Conjunctiva/sclera: Conjunctivae normal.     Pupils: Pupils are equal, round, and reactive to light.  Neck:     Musculoskeletal: Normal range of motion and  neck supple.  Cardiovascular:     Rate and Rhythm: Normal rate and regular rhythm.     Heart sounds: No murmur.  Pulmonary:     Effort: Pulmonary effort is normal. No respiratory distress.     Breath sounds: Normal breath sounds. No wheezing, rhonchi or rales.  Chest:     Chest wall: No tenderness.  Abdominal:     Palpations: Abdomen is soft.     Tenderness: There is no abdominal tenderness.  Musculoskeletal:        General: No swelling or tenderness.  Lymphadenopathy:     Cervical: No cervical adenopathy.  Skin:    General: Skin is warm and dry.     Capillary Refill: Capillary refill takes less than 2 seconds.     Findings: No erythema.  Neurological:     Mental Status: She is alert.     GCS: GCS eye subscore is 4. GCS verbal subscore is 5. GCS motor subscore is 6.     Cranial Nerves: No cranial nerve deficit.     Sensory: No sensory deficit.     Motor: No weakness.     Coordination: Coordination normal.  Psychiatric:        Mood and Affect: Mood normal.      ED Treatments / Results  Labs (all labs ordered are listed, but only abnormal results are displayed) Labs Reviewed  GROUP A STREP BY PCR    EKG None  Radiology US Venous Img Lower Unilateral Right  Result Date: 11/08/2018 CLINICAL DATA:  58 year old with right calf and popliteal pain. EXAM: RIGHT LOWER EXTREMITY VENOUS DOPPLER ULTRASOUND TECHNIQUE: Gray-scale sonography with graded compression, as well as color Doppler and duplex ultrasound were performed to evaluate the lower extremity deep venous systems from the level of the common femoral vein and including the common femoral, femoral, profunda femoral, popliteal and calf veins including the posterior tibial, peroneal and gastrocnemius veins when visible. The superficial great saphenous vein was also interrogated. Spectral Doppler was utilized to evaluate flow at rest and with distal augmentation maneuvers in the common femoral, femoral and popliteal veins.  COMPARISON:  None. FINDINGS: Contralateral Common Femoral Vein: Respiratory phasicity is normal and symmetric with the symptomatic side. No evidence of thrombus. Normal compressibility. Common Femoral Vein: No evidence of thrombus. Normal compressibility, respiratory phasicity and response to augmentation. Saphenofemoral Junction: No evidence of thrombus. Normal compressibility and flow on color Doppler imaging. Profunda Femoral Vein: No evidence of thrombus. Normal compressibility and flow on color Doppler imaging. Femoral Vein: No evidence of thrombus. Normal compressibility, respiratory phasicity and response to augmentation. Popliteal Vein: No evidence of thrombus. Normal compressibility, respiratory phasicity and response to augmentation. Calf Veins: No evidence of thrombus. Normal compressibility and flow on color Doppler imaging. Superficial Great Saphenous Vein: No evidence of thrombus. Normal compressibility. Venous Reflux:  None. Other Findings: Small hypoechoic collection in the popliteal fossa. This collection measures 4.2 x 0.9  x 2.1 cm. Findings are most compatible with small cyst. IMPRESSION: Negative for deep venous thrombosis in right lower extremity. Small right knee Baker's cyst. Electronically Signed   By: Richarda Overlie M.D.   On: 11/08/2018 10:44    Procedures Procedures (including critical care time)  Medications Ordered in ED Medications - No data to display   Initial Impression / Assessment and Plan / ED Course  I have reviewed the triage vital signs and the nursing notes.  Pertinent labs & imaging results that were available during my care of the patient were reviewed by me and considered in my medical decision making (see chart for details).        Jeanette Dodson is a 58 y.o. female with a past medical history significant for hypertension, chronic pain, thyroid disease, and prior hysterectomy who presents with URI symptoms.  Patient reports that for the last 2 days she has  had subjective fevers, chills, rhinorrhea, congestion, sore throat, cough, myalgias, malaise, diarrhea, and nausea.  She reports that multiple grandchildren have had URI and viral symptoms this week.  She is been exposed to them.  She reports that she did take her flu shot this year.  She reports that yesterday is when her symptoms began.  She denies significant chest pain or chest pressure.  She denies significant shortness of breath.  She reports no wheezing.  She reports that she thinks she has a severe cold but wanted to be evaluated.  She reports the myalgias are severe in all extremities and she is also having pain behind her right knee and calf.  She reports has had knee surgery in the past and has had some knee pains on and off however she wanted to make sure she did not have a blood clot.  She denies significant swelling.  On exam, patient had some tenderness in her right calf and right popliteal fossa but she had no anterior knee tenderness.  No swelling, erythema, or warmth of the knee.  His lungs were clear and chest was nontender.  Abdomen nontender.  No focal neurologic deficits on initial exam.  Vital signs showed some of his blood pressure but otherwise was reassuring.  Patient was afebrile here.  Patient had some erythema in the posterior oropharynx but no significant tonsillar exudate or evidence of PTA or RPA.  Clinical aspect patient has an influenza-like illness or flu.  I suspect her myalgias have exacerbated her chronic right leg pains causing the popliteal and calf pain.  However, as patient is significant concerned about DVT, ultrasound be ordered to rule this out.  With the sore throat and erythema, she will have a strep swab to look for strep throat.  If work-up is reassuring, patient will be discharged with prescription for Tamiflu as her symptoms began yesterday.  We had a sure decision making and decided not to pursue x-ray or labs at this time given otherwise well appearance and  improved symptoms.  Anticipate reassessment discharge after work-up.  12:47 PM Patient strep test was negative.  Patient's ultrasound showed Baker's cyst but no DVT.  Suspect influenza causing patient symptoms.  Patient be treated with Tamiflu and she reports her nausea is improved and she does not want nausea medication.  Patient will stay hydrated and follow-up with PCP.  Patient was understanding plan of care and was discharged in good condition with her symptoms.    Final Clinical Impressions(s) / ED Diagnoses   Final diagnoses:  Influenza-like illness  Upper respiratory  tract infection, unspecified type  Myalgia  Baker cyst, right    ED Discharge Orders         Ordered    oseltamivir (TAMIFLU) 75 MG capsule  Every 12 hours     11/08/18 1245         Clinical Impression: 1. Influenza-like illness   2. Upper respiratory tract infection, unspecified type   3. Myalgia   4. Baker cyst, right     Disposition: Discharge  Condition: Good  I have discussed the results, Dx and Tx plan with the pt(& family if present). He/she/they expressed understanding and agree(s) with the plan. Discharge instructions discussed at great length. Strict return precautions discussed and pt &/or family have verbalized understanding of the instructions. No further questions at time of discharge.    New Prescriptions   OSELTAMIVIR (TAMIFLU) 75 MG CAPSULE    Take 1 capsule (75 mg total) by mouth every 12 (twelve) hours.    Follow Up: Center, [facility] [address] [place] [address] [phone]     [facility] [address] [id number] [id number] [address] [phone]       Tegeler, Canary Brim, MD 11/08/18 1247

## 2018-11-08 NOTE — ED Notes (Signed)
Pt provided with juice.

## 2018-11-08 NOTE — ED Triage Notes (Addendum)
Pt c/o fever, sore throat, cough with chills. Also c/o bilateral ear pain and headache x 2 days.

## 2018-11-08 NOTE — ED Notes (Addendum)
Pt c/o fever, sore throat, cough with chills. Also c/o bilateral ear pain and headache x 2 days. Pt states she has nasal and chest congestion. Denies sob, denies N/V, pt denies taking medications for symptoms, and has not taken b/p medication today.

## 2021-03-26 ENCOUNTER — Other Ambulatory Visit: Payer: Self-pay

## 2021-03-26 ENCOUNTER — Encounter (HOSPITAL_BASED_OUTPATIENT_CLINIC_OR_DEPARTMENT_OTHER): Payer: Self-pay

## 2021-03-26 ENCOUNTER — Emergency Department (HOSPITAL_BASED_OUTPATIENT_CLINIC_OR_DEPARTMENT_OTHER): Payer: 59

## 2021-03-26 ENCOUNTER — Emergency Department (HOSPITAL_BASED_OUTPATIENT_CLINIC_OR_DEPARTMENT_OTHER)
Admission: EM | Admit: 2021-03-26 | Discharge: 2021-03-26 | Disposition: A | Discharge status: Home / Self Care | Payer: 59 | Attending: Emergency Medicine | Admitting: Emergency Medicine

## 2021-03-26 DIAGNOSIS — Z79899 Other long term (current) drug therapy: Secondary | ICD-10-CM | POA: Insufficient documentation

## 2021-03-26 DIAGNOSIS — R0789 Other chest pain: Secondary | ICD-10-CM

## 2021-03-26 DIAGNOSIS — R110 Nausea: Secondary | ICD-10-CM | POA: Insufficient documentation

## 2021-03-26 DIAGNOSIS — E876 Hypokalemia: Secondary | ICD-10-CM | POA: Diagnosis not present

## 2021-03-26 DIAGNOSIS — E119 Type 2 diabetes mellitus without complications: Secondary | ICD-10-CM | POA: Diagnosis not present

## 2021-03-26 DIAGNOSIS — E039 Hypothyroidism, unspecified: Secondary | ICD-10-CM | POA: Insufficient documentation

## 2021-03-26 DIAGNOSIS — M542 Cervicalgia: Secondary | ICD-10-CM | POA: Insufficient documentation

## 2021-03-26 DIAGNOSIS — I10 Essential (primary) hypertension: Secondary | ICD-10-CM | POA: Diagnosis not present

## 2021-03-26 LAB — LIPASE, BLOOD: Lipase: 23 U/L (ref 11–51)

## 2021-03-26 LAB — CBC
HCT: 41.4 % (ref 36.0–46.0)
Hemoglobin: 13.7 g/dL (ref 12.0–15.0)
MCH: 28.2 pg (ref 26.0–34.0)
MCHC: 33.1 g/dL (ref 30.0–36.0)
MCV: 85.2 fL (ref 80.0–100.0)
Platelets: 258 10*3/uL (ref 150–400)
RBC: 4.86 MIL/uL (ref 3.87–5.11)
RDW: 14.3 % (ref 11.5–15.5)
WBC: 9.2 10*3/uL (ref 4.0–10.5)
nRBC: 0 % (ref 0.0–0.2)

## 2021-03-26 LAB — BASIC METABOLIC PANEL
Anion gap: 10 (ref 5–15)
BUN: 16 mg/dL (ref 6–20)
CO2: 27 mmol/L (ref 22–32)
Calcium: 9.1 mg/dL (ref 8.9–10.3)
Chloride: 101 mmol/L (ref 98–111)
Creatinine, Ser: 0.96 mg/dL (ref 0.44–1.00)
GFR, Estimated: >60 mL/min (ref >60)
Glucose, Bld: 128 mg/dL — ABNORMAL HIGH (ref 70–99)
Potassium: 3.2 mmol/L — ABNORMAL LOW (ref 3.5–5.1)
Sodium: 138 mmol/L (ref 135–145)

## 2021-03-26 LAB — HEPATIC FUNCTION PANEL
ALT: 28 U/L (ref 0–44)
AST: 25 U/L (ref 15–41)
Albumin: 3.7 g/dL (ref 3.5–5.0)
Alkaline Phosphatase: 72 U/L (ref 38–126)
Bilirubin, Direct: 0.1 mg/dL (ref 0.0–0.2)
Indirect Bilirubin: 0.4 mg/dL (ref 0.3–0.9)
Total Bilirubin: 0.5 mg/dL (ref 0.3–1.2)
Total Protein: 7.6 g/dL (ref 6.5–8.1)

## 2021-03-26 LAB — TROPONIN I (HIGH SENSITIVITY): Troponin I (High Sensitivity): 3 ng/L (ref <18)

## 2021-03-26 MED ORDER — ONDANSETRON 4 MG PO TBDP: 4.0000 mg | ORAL_TABLET | Freq: Three times a day (TID) | ORAL | 0 refills | Status: AC | PRN

## 2021-03-26 MED ORDER — PANTOPRAZOLE SODIUM 20 MG PO TBEC: 20.0000 mg | DELAYED_RELEASE_TABLET | Freq: Every day | ORAL | 0 refills | Status: AC

## 2021-03-26 MED ORDER — POTASSIUM CHLORIDE CRYS ER 20 MEQ PO TBCR
40.0000 meq | EXTENDED_RELEASE_TABLET | Freq: Once | ORAL | Status: AC
  Administered 2021-03-26: 40 meq via ORAL
  Filled 2021-03-26: qty 2

## 2021-03-26 NOTE — ED Triage Notes (Signed)
Pt c/o intermittent CP, neck and left UE numbness-states she had a nerve block in her neck 7/8-sx started after nerve block-stares CP worse x 2-3 hours-NAD-steady gait

## 2021-03-26 NOTE — ED Provider Notes (Signed)
[facility] EMERGENCY DEPARTMENT Provider Note   CSN: [id number] Arrival date & time: 03/26/21  2013     History Chief Complaint  Patient presents with   Chest Pain    Jeanette Dodson is a 60 y.o. female.  The history is provided by the patient and medical records.  Chest Pain Jeanette Dodson is a 60 y.o. female who presents to the Emergency Department complaining of chest pain.  She experienced burning central chest pain with frequent belching for the last two weeks.  Today the pain became more burning, prompting her to seek evaluation.  Sxs improved with belching.  Pain waxes and wanes.  Pain is worse when she has increased neck pain.    Had Cervical block on 7/7, C5-6.  Since then she has experienced nausea and chest discomfort.    No fever, sob, cough, abdominal pain, vomiting, diarrhea, constipation, leg swelling/pain.    Has a hx/o HTN, DM, hypothyroid.  No OCP/hormones.  No hx/o DVT/PE, CAD.  No tobacco, alcohol, drugs.  Has a hx/o partial hysterectomy.      Past Medical History:  Diagnosis Date   Back pain    Hypertension    Neck pain    Thyroid disease     There are no problems to display for this patient.   Past Surgical History:  Procedure Laterality Date   ABDOMINAL HYSTERECTOMY     ANKLE SURGERY     KNEE SURGERY Left      OB History   No obstetric history on file.     No family history on file.  Social History   Tobacco Use   Smoking status: Never   Smokeless tobacco: Never  Substance Use Topics   Alcohol use: No   Drug use: No    Home Medications Prior to Admission medications   Medication Sig Start Date End Date Taking? Authorizing Provider  ondansetron (ZOFRAN ODT) 4 MG disintegrating tablet Take 1 tablet (4 mg total) by mouth every 8 (eight) hours as needed for nausea or vomiting. 03/26/21  Yes Tilden Fossa, MD  pantoprazole (PROTONIX) 20 MG tablet Take 1 tablet (20 mg total) by mouth daily. 03/26/21  Yes Tilden Fossa,  MD  HYDROcodone-acetaminophen (NORCO/VICODIN) 5-325 MG per tablet Take 1-2 tablets every 6 hours as needed for severe pain 02/22/15   Renne Crigler, PA-C  Levothyroxine Sodium (SYNTHROID PO) Take by mouth.    [provider]  oseltamivir (TAMIFLU) 75 MG capsule Take 1 capsule (75 mg total) by mouth every 12 (twelve) hours. 11/08/18   Tegeler, Canary Brim, MD  Valsartan (DIOVAN PO) Take by mouth.    [provider]  Vitamin D, Ergocalciferol, (DRISDOL) 50000 UNITS CAPS capsule Take 50,000 Units by mouth every 7 (seven) days.    [provider]    Allergies    Patient has no known allergies.  Review of Systems   Review of Systems  Cardiovascular:  Positive for chest pain.  All other systems reviewed and are negative.  Physical Exam Updated Vital Signs BP (!) 144/86   Pulse 80   Temp 98.4 F (36.9 C) (Oral)   Resp 18   Ht 5\' 6"  (1.676 m)   Wt 126.1 kg   SpO2 98%   BMI 44.87 kg/m   Physical Exam Vitals and nursing note reviewed.  Constitutional:      Appearance: She is well-developed.  HENT:     Head: Normocephalic and atraumatic.  Cardiovascular:     Rate and  Rhythm: Normal rate and regular rhythm.     Heart sounds: No murmur heard. Pulmonary:     Effort: Pulmonary effort is normal. No respiratory distress.     Breath sounds: Normal breath sounds.  Abdominal:     Palpations: Abdomen is soft.     Tenderness: There is no abdominal tenderness. There is no guarding or rebound.  Musculoskeletal:        General: No swelling or tenderness.     Comments: 2+ radial pulses bilaterally  Skin:    General: Skin is warm and dry.  Neurological:     Mental Status: She is alert and oriented to person, place, and time.  Psychiatric:        Behavior: Behavior normal.    ED Results / Procedures / Treatments   Labs (all labs ordered are listed, but only abnormal results are displayed) Labs Reviewed  BASIC METABOLIC PANEL - Abnormal; Notable for the  following components:      Result Value   Potassium 3.2 (*)    Glucose, Bld 128 (*)    All other components within normal limits  CBC  HEPATIC FUNCTION PANEL  LIPASE, BLOOD  TROPONIN I (HIGH SENSITIVITY)  TROPONIN I (HIGH SENSITIVITY)    EKG EKG Interpretation  Date/Time:  Wednesday March 26 2021 20:20:15 EDT Ventricular Rate:  97 PR Interval:  132 QRS Duration: 76 QT Interval:  346 QTC Calculation: 439 R Axis:   -55 Text Interpretation: Normal sinus rhythm Left axis deviation Low voltage QRS Cannot rule out Anterior infarct , age undetermined Abnormal ECG Confirmed by Tilden Fossa [phone]) on 03/26/2021 8:25:14 PM  Radiology DG Chest 2 View  Result Date: 03/26/2021 CLINICAL DATA:  Intermittent chest pain, neck and left upper extremity numbness, recent cervical nerve block EXAM: CHEST - 2 VIEW COMPARISON:  07/25/2016 FINDINGS: Frontal and lateral views of the chest demonstrate an unremarkable cardiac silhouette. No acute airspace disease, effusion, or pneumothorax. No acute bony abnormalities. IMPRESSION: 1. No acute intrathoracic process. Electronically Signed   By: Sharlet Salina M.D.   On: 03/26/2021 20:43    Procedures Procedures   Medications Ordered in ED Medications  potassium chloride SA (KLOR-CON) CR tablet 40 mEq (40 mEq Oral Given 03/26/21 2235)    ED Course  I have reviewed the triage vital signs and the nursing notes.  Pertinent labs & imaging results that were available during my care of the patient were reviewed by me and considered in my medical decision making (see chart for details).    MDM Rules/Calculators/A&P                          patient here for evaluation of two weeks of intermittent chest pain. Symptoms started after a cervical block. She has G.I. symptoms associated with chest discomfort. EKG is without acute ischemic changes and she has a negative troponin. Presentation is not consistent with PE, dissection, ACS, bowel obstruction. Discussed  with patient likely reflux contributing to her symptoms. Will start trial of PPI with outpatient follow-up and return precautions.  Final Clinical Impression(s) / ED Diagnoses Final diagnoses:  Atypical chest pain  Hypokalemia    Rx / DC Orders ED Discharge Orders          Ordered    pantoprazole (PROTONIX) 20 MG tablet  Daily        03/26/21 2229    ondansetron (ZOFRAN ODT) 4 MG disintegrating tablet  Every 8 hours PRN  03/26/21 2229             Tilden Fossa, MD 03/26/21 2326

## 2021-09-01 ENCOUNTER — Emergency Department (HOSPITAL_BASED_OUTPATIENT_CLINIC_OR_DEPARTMENT_OTHER)
Admission: EM | Admit: 2021-09-01 | Discharge: 2021-09-01 | Disposition: A | Discharge status: Home / Self Care | Payer: 59 | Attending: Emergency Medicine | Admitting: Emergency Medicine

## 2021-09-01 ENCOUNTER — Encounter (HOSPITAL_BASED_OUTPATIENT_CLINIC_OR_DEPARTMENT_OTHER): Payer: Self-pay | Admitting: *Deleted

## 2021-09-01 ENCOUNTER — Other Ambulatory Visit: Payer: Self-pay

## 2021-09-01 DIAGNOSIS — R000 Tachycardia, unspecified: Secondary | ICD-10-CM | POA: Insufficient documentation

## 2021-09-01 DIAGNOSIS — I10 Essential (primary) hypertension: Secondary | ICD-10-CM | POA: Diagnosis not present

## 2021-09-01 DIAGNOSIS — U071 COVID-19: Secondary | ICD-10-CM | POA: Diagnosis not present

## 2021-09-01 DIAGNOSIS — R059 Cough, unspecified: Secondary | ICD-10-CM | POA: Diagnosis present

## 2021-09-01 DIAGNOSIS — Z79899 Other long term (current) drug therapy: Secondary | ICD-10-CM | POA: Insufficient documentation

## 2021-09-01 LAB — RESP PANEL BY RT-PCR (FLU A&B, COVID) ARPGX2
Influenza A by PCR: NEGATIVE
Influenza B by PCR: NEGATIVE
SARS Coronavirus 2 by RT PCR: POSITIVE — AB

## 2021-09-01 MED ORDER — LIDOCAINE VISCOUS HCL 2 % MT SOLN
15.0000 mL | Freq: Once | OROMUCOSAL | Status: AC
  Administered 2021-09-01: 13:00:00 15 mL via OROMUCOSAL
  Filled 2021-09-01: qty 15

## 2021-09-01 MED ORDER — BENZONATATE 100 MG PO CAPS: 100.0000 mg | ORAL_CAPSULE | Freq: Three times a day (TID) | ORAL | 0 refills | Status: AC

## 2021-09-01 MED ORDER — LIDOCAINE VISCOUS HCL 2 % MT SOLN: 15.0000 mL | OROMUCOSAL | 0 refills | Status: AC | PRN

## 2021-09-01 MED ORDER — NIRMATRELVIR/RITONAVIR (PAXLOVID)TABLET: 3.0000 | ORAL_TABLET | Freq: Two times a day (BID) | ORAL | 0 refills | Status: AC

## 2021-09-01 NOTE — ED Provider Notes (Signed)
[facility] EMERGENCY DEPARTMENT Provider Note   CSN: [id number] Arrival date & time: 09/01/21  1109     History Chief Complaint  Patient presents with   URI    Jeanette Dodson is a 60 y.o. female.  HPI  Patient presents with 3 days of cough, nasal congestion, sore throat, headache, lateral ear pain, body aches.  She has been trying Tylenol and Mucinex.  The Tylenol helps with her headache, the Mucinex has not improved her symptoms at all.  No obvious aggravating or provoking factors.  She has not been around anybody sick that she knows of.  Denies any chest pain or shortness of breath.  Past Medical History:  Diagnosis Date   Back pain    Hypertension    Neck pain    Thyroid disease     There are no problems to display for this patient.   Past Surgical History:  Procedure Laterality Date   ABDOMINAL HYSTERECTOMY     ANKLE SURGERY     KNEE SURGERY Left      OB History   No obstetric history on file.     No family history on file.  Social History   Tobacco Use   Smoking status: Never   Smokeless tobacco: Never  Vaping Use   Vaping Use: Never used  Substance Use Topics   Alcohol use: No   Drug use: No    Home Medications Prior to Admission medications   Medication Sig Start Date End Date Taking? Authorizing Provider  HYDROcodone-acetaminophen (NORCO/VICODIN) 5-325 MG per tablet Take 1-2 tablets every 6 hours as needed for severe pain 02/22/15   Renne Crigler, PA-C  Levothyroxine Sodium (SYNTHROID PO) Take by mouth.    [provider]  ondansetron (ZOFRAN ODT) 4 MG disintegrating tablet Take 1 tablet (4 mg total) by mouth every 8 (eight) hours as needed for nausea or vomiting. 03/26/21   Tilden Fossa, MD  oseltamivir (TAMIFLU) 75 MG capsule Take 1 capsule (75 mg total) by mouth every 12 (twelve) hours. 11/08/18   Tegeler, Canary Brim, MD  pantoprazole (PROTONIX) 20 MG tablet Take 1 tablet (20 mg total) by mouth daily. 03/26/21   Tilden Fossa, MD  Valsartan (DIOVAN PO) Take by mouth.    [provider]  Vitamin D, Ergocalciferol, (DRISDOL) 50000 UNITS CAPS capsule Take 50,000 Units by mouth every 7 (seven) days.    [provider]    Allergies    Patient has no known allergies.  Review of Systems   Review of Systems  Constitutional:  Positive for fever.  HENT:  Positive for congestion and sore throat.   Respiratory:  Negative for shortness of breath.   Cardiovascular:  Negative for chest pain.  Musculoskeletal:  Positive for myalgias.  Neurological:  Positive for headaches.   Physical Exam Updated Vital Signs BP (!) 163/96 (BP Location: Right Arm)    Pulse (!) 111    Temp 98.4 F (36.9 C) (Oral)    Resp 20    Ht 5\' 6"  (1.676 m)    Wt 126.1 kg    SpO2 98%    BMI 44.87 kg/m   Physical Exam Vitals and nursing note reviewed. Exam conducted with a chaperone present.  Constitutional:      Appearance: Normal appearance.  HENT:     Head: Normocephalic.     Nose: Congestion present.     Mouth/Throat:     Pharynx: No posterior oropharyngeal erythema.  Eyes:  Extraocular Movements: Extraocular movements intact.     Pupils: Pupils are equal, round, and reactive to light.  Cardiovascular:     Rate and Rhythm: Regular rhythm. Tachycardia present.  Pulmonary:     Effort: Pulmonary effort is normal.     Breath sounds: Normal breath sounds.     Comments: Lungs CTA bilaterally. No accessory muscle use. Speaking in complete sentences.  Abdominal:     General: Abdomen is flat.     Palpations: Abdomen is soft.  Musculoskeletal:     Cervical back: Normal range of motion.  Neurological:     Mental Status: She is alert.  Psychiatric:        Mood and Affect: Mood normal.    ED Results / Procedures / Treatments   Labs (all labs ordered are listed, but only abnormal results are displayed) Labs Reviewed  RESP PANEL BY RT-PCR (FLU A&B, COVID) [id number]    EKG None  Radiology No results  found.  Procedures Procedures   Medications Ordered in ED Medications - No data to display  ED Course  I have reviewed the triage vital signs and the nursing notes.  Pertinent labs & imaging results that were available during my care of the patient were reviewed by me and considered in my medical decision making (see chart for details).    MDM Rules/Calculators/A&P                         Patient is mildly tachycardic and hypertensive, lungs are clear to auscultation bilaterally however.  She not having any chest pain or shortness of breath, her presentation is consistent with a URI.  We will start with a viral panel.  Patient is COVID-positive.  No hypoxia or tachypnea.  Do not feel she needs additional work-up at this time.  Patient discharged in stable condition.     Final Clinical Impression(s) / ED Diagnoses Final diagnoses:  None    Rx / DC Orders ED Discharge Orders     None        Theron Arista, Cordelia Poche 09/01/21 1259    Cheryll Cockayne, MD 09/04/21 [phone]

## 2021-09-01 NOTE — ED Triage Notes (Signed)
Cough, fever, ear pain, congestion and sore throat x 2 days. Her home Covid test have been negative.

## 2021-09-01 NOTE — Discharge Instructions (Addendum)
You have COVID.  You will need to quarantine at home for the next 5 days, return to work after that but still wear a mask if you are symptomatic.  Take Tylenol and Motrin as needed for pain.  You can also use the Tessalon Perles every 8 hours as needed for coughing.

## 2021-10-09 IMAGING — CR DG CHEST 2V
2 series · 2 of 2 positions shown · non-contrast
Comparison: 07/25/2016

CLINICAL DATA: Intermittent chest pain, neck and left upper
extremity numbness, recent cervical nerve block

EXAM:
CHEST - 2 VIEW

[w chest pa]
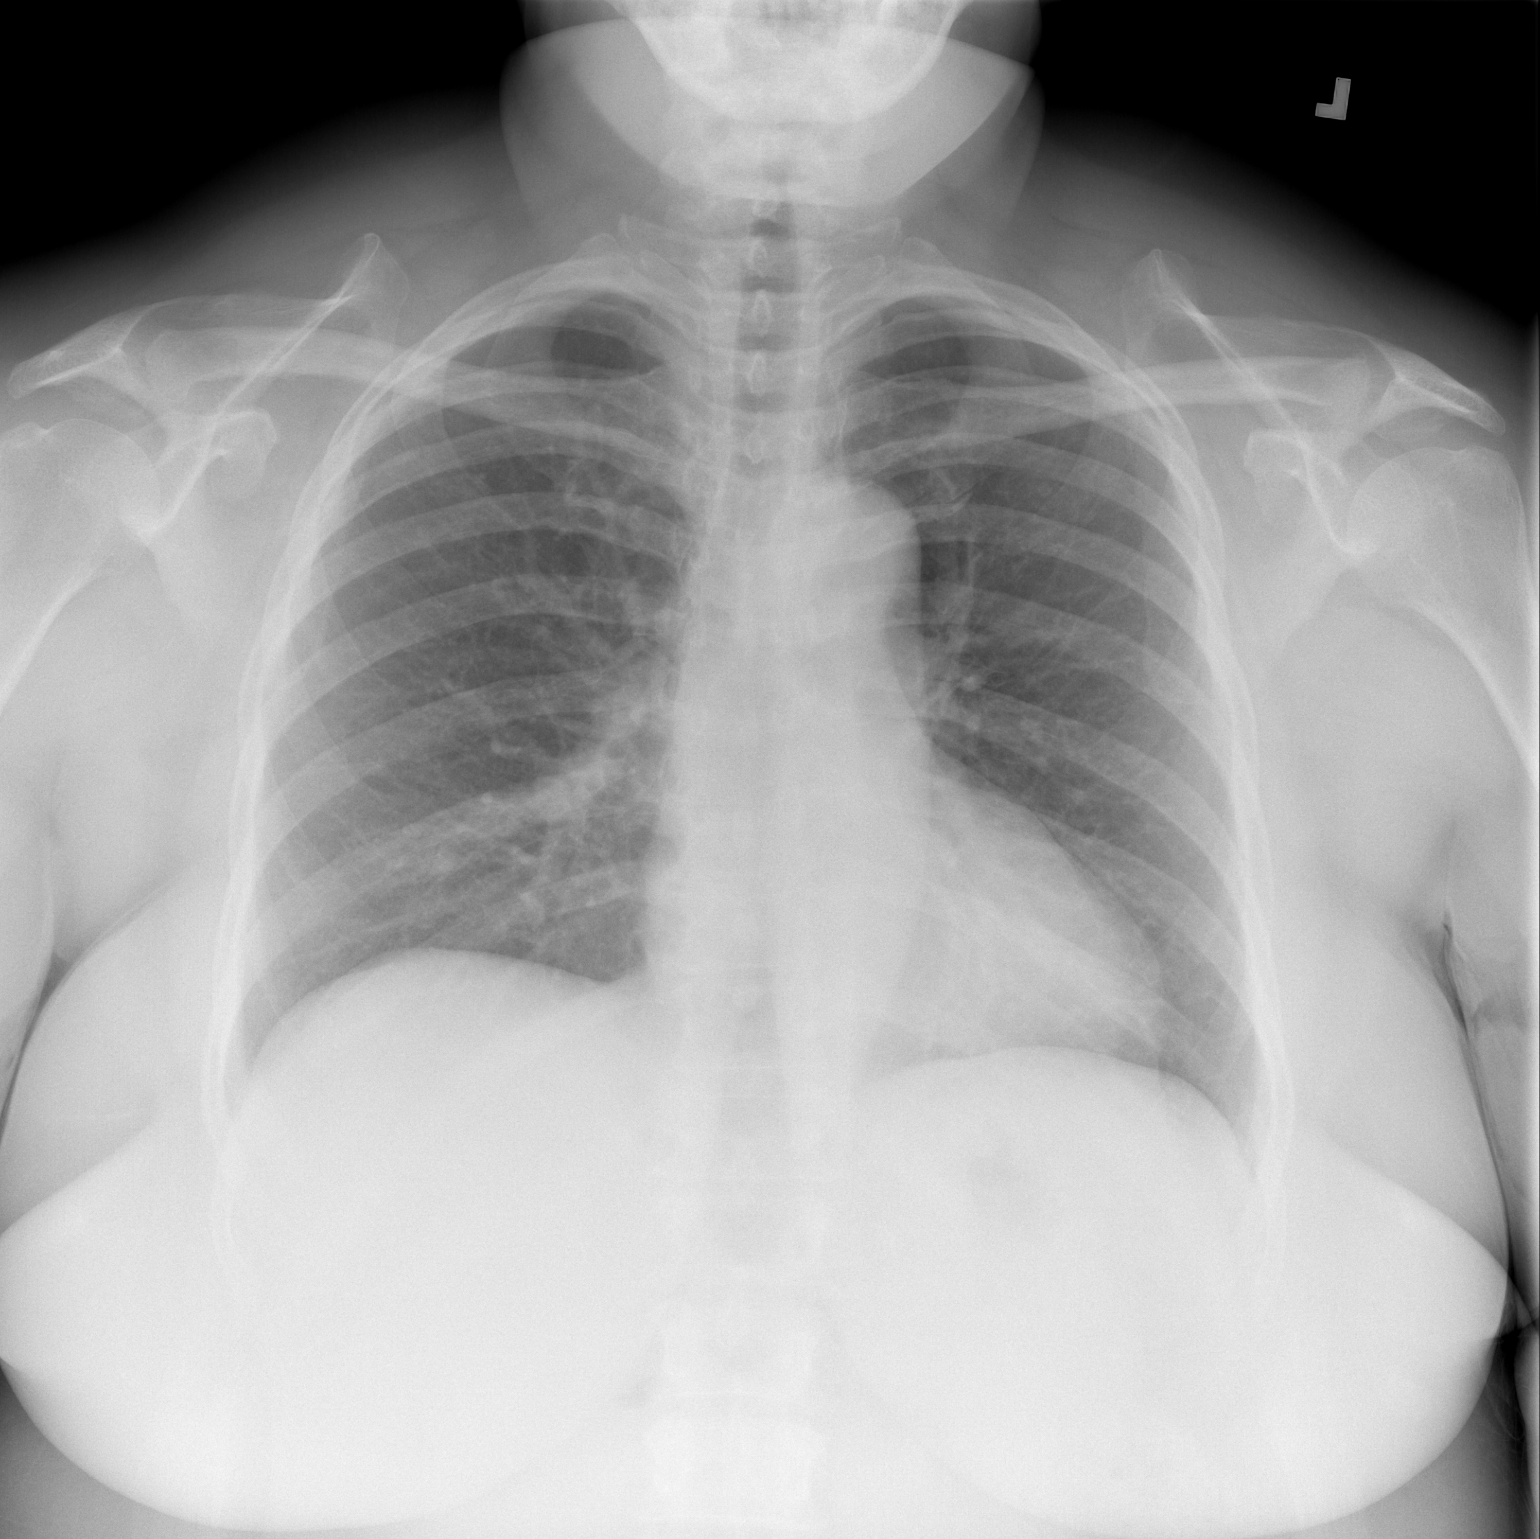

[w chest lat]
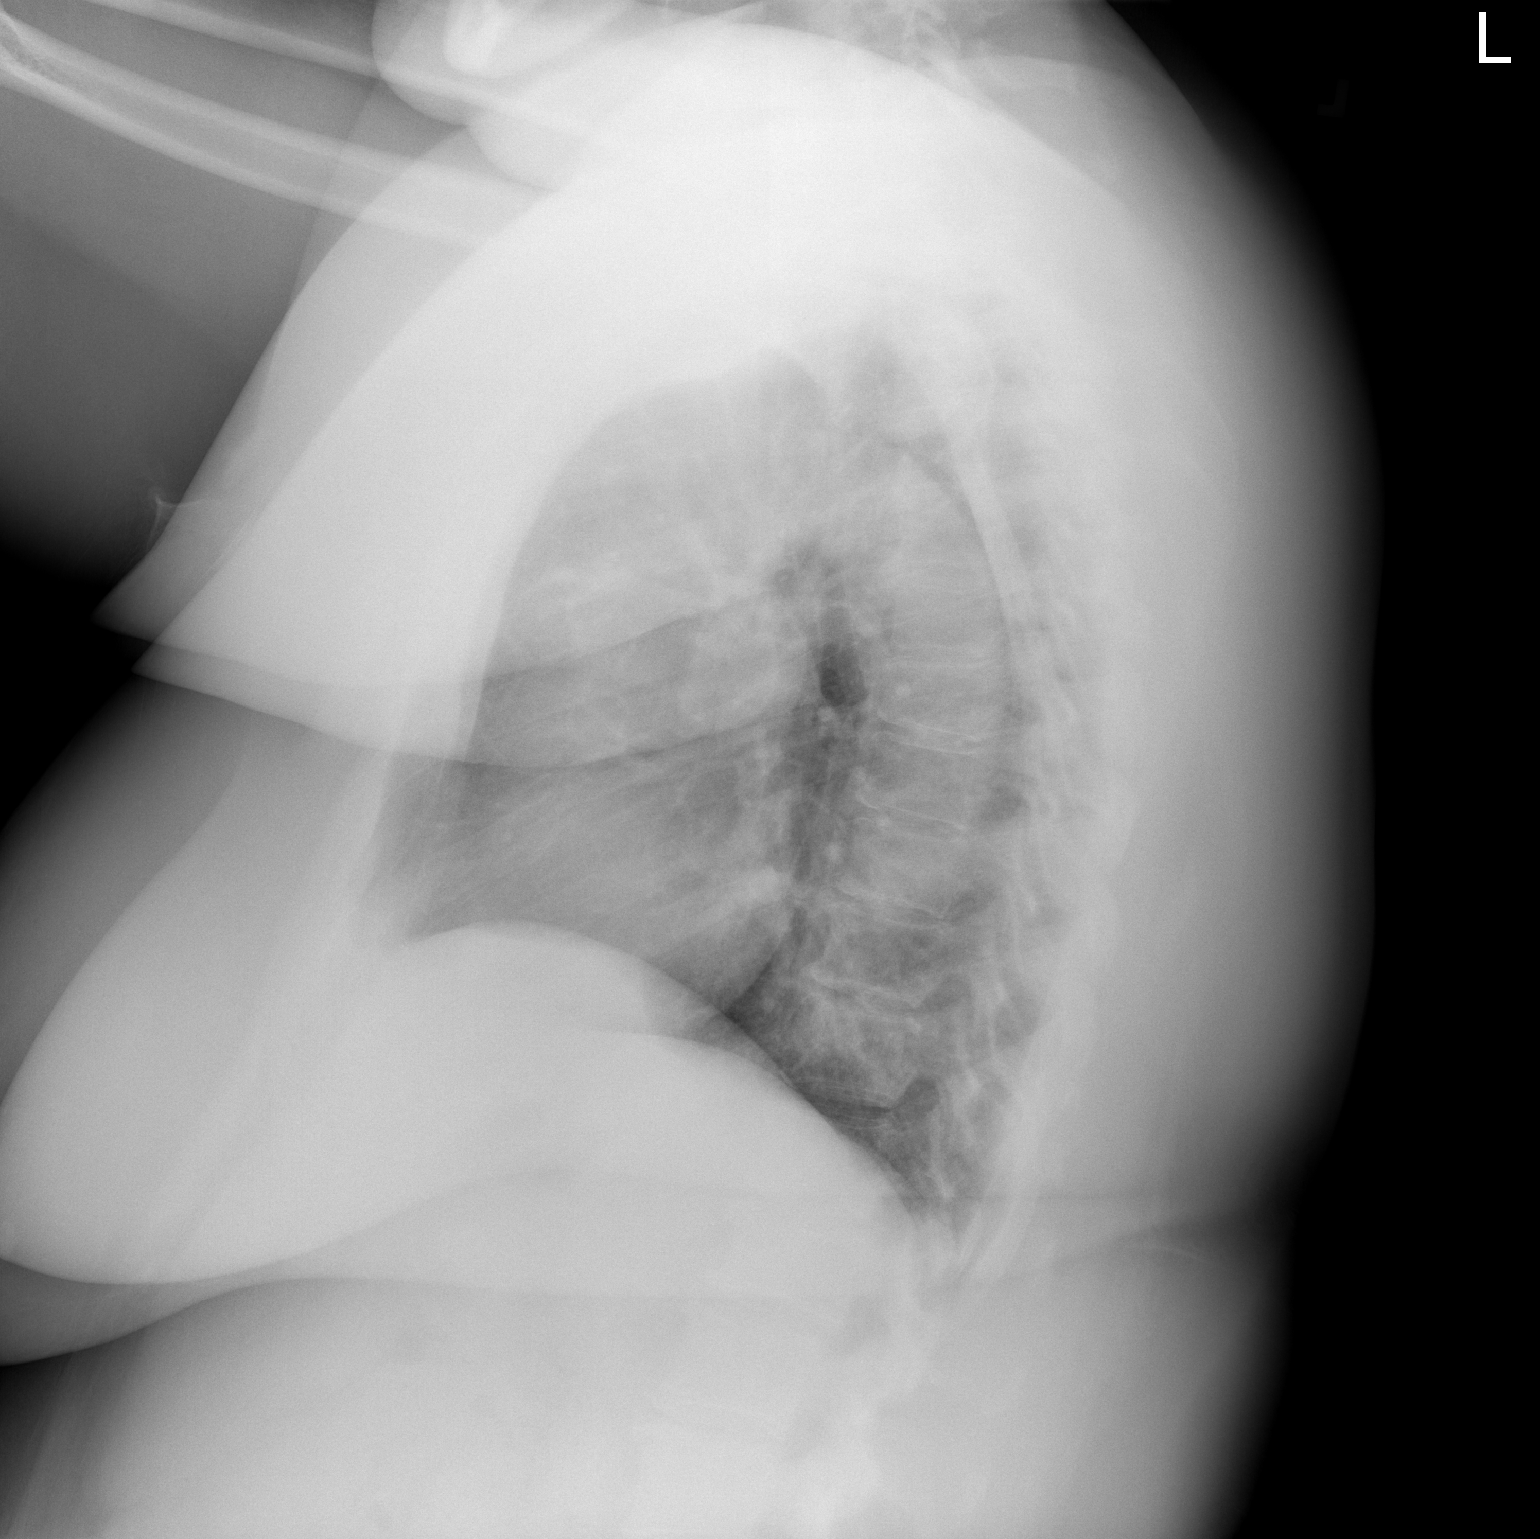

[2 of 2 positions shown; findings below may reference images not displayed]

FINDINGS: Frontal and lateral views of the chest demonstrate an unremarkable
cardiac silhouette. No acute airspace disease, effusion, or
pneumothorax. No acute bony abnormalities.
IMPRESSION: 1. No acute intrathoracic process.

## 2023-01-04 ENCOUNTER — Encounter (HOSPITAL_BASED_OUTPATIENT_CLINIC_OR_DEPARTMENT_OTHER): Payer: Self-pay | Admitting: Emergency Medicine

## 2023-01-04 ENCOUNTER — Emergency Department (HOSPITAL_BASED_OUTPATIENT_CLINIC_OR_DEPARTMENT_OTHER)
Admission: EM | Admit: 2023-01-04 | Discharge: 2023-01-04 | Disposition: A | Discharge status: Home / Self Care | Payer: 59 | Attending: Emergency Medicine | Admitting: Emergency Medicine

## 2023-01-04 ENCOUNTER — Other Ambulatory Visit: Payer: Self-pay

## 2023-01-04 DIAGNOSIS — R109 Unspecified abdominal pain: Secondary | ICD-10-CM

## 2023-01-04 DIAGNOSIS — R112 Nausea with vomiting, unspecified: Secondary | ICD-10-CM | POA: Diagnosis not present

## 2023-01-04 LAB — COMPREHENSIVE METABOLIC PANEL
ALT: 23 U/L (ref 0–44)
AST: 28 U/L (ref 15–41)
Albumin: 3.8 g/dL (ref 3.5–5.0)
Alkaline Phosphatase: 71 U/L (ref 38–126)
Anion gap: 12 (ref 5–15)
BUN: 11 mg/dL (ref 8–23)
CO2: 25 mmol/L (ref 22–32)
Calcium: 9.3 mg/dL (ref 8.9–10.3)
Chloride: 101 mmol/L (ref 98–111)
Creatinine, Ser: 0.9 mg/dL (ref 0.44–1.00)
GFR, Estimated: >60 mL/min (ref >60)
Glucose, Bld: 114 mg/dL — ABNORMAL HIGH (ref 70–99)
Potassium: 3.5 mmol/L (ref 3.5–5.1)
Sodium: 138 mmol/L (ref 135–145)
Total Bilirubin: 0.9 mg/dL (ref 0.3–1.2)
Total Protein: 8 g/dL (ref 6.5–8.1)

## 2023-01-04 LAB — URINALYSIS, MICROSCOPIC (REFLEX)

## 2023-01-04 LAB — URINALYSIS, ROUTINE W REFLEX MICROSCOPIC
Glucose, UA: NEGATIVE mg/dL
Hgb urine dipstick: NEGATIVE
Ketones, ur: NEGATIVE mg/dL
Leukocytes,Ua: NEGATIVE
Nitrite: NEGATIVE
Protein, ur: 100 mg/dL — AB
Specific Gravity, Urine: >=1.03 (ref 1.005–1.030)
pH: 5.5 (ref 5.0–8.0)

## 2023-01-04 LAB — LIPASE, BLOOD: Lipase: 20 U/L (ref 11–51)

## 2023-01-04 LAB — CBC
HCT: 43.7 % (ref 36.0–46.0)
Hemoglobin: 14.5 g/dL (ref 12.0–15.0)
MCH: 28.4 pg (ref 26.0–34.0)
MCHC: 33.2 g/dL (ref 30.0–36.0)
MCV: 85.5 fL (ref 80.0–100.0)
Platelets: 274 10*3/uL (ref 150–400)
RBC: 5.11 MIL/uL (ref 3.87–5.11)
RDW: 13.7 % (ref 11.5–15.5)
WBC: 9.6 10*3/uL (ref 4.0–10.5)
nRBC: 0 % (ref 0.0–0.2)

## 2023-01-04 MED ORDER — KETOROLAC TROMETHAMINE 30 MG/ML IJ SOLN
15.0000 mg | Freq: Once | INTRAMUSCULAR | Status: AC
  Administered 2023-01-04: 15 mg via INTRAVENOUS
  Filled 2023-01-04: qty 1

## 2023-01-04 MED ORDER — ONDANSETRON HCL 4 MG/2ML IJ SOLN
4.0000 mg | Freq: Once | INTRAMUSCULAR | Status: AC
  Administered 2023-01-04: 4 mg via INTRAVENOUS
  Filled 2023-01-04: qty 2

## 2023-01-04 MED ORDER — SODIUM CHLORIDE 0.9 % IV BOLUS
1000.0000 mL | Freq: Once | INTRAVENOUS | Status: AC
  Administered 2023-01-04: 1000 mL via INTRAVENOUS

## 2023-01-04 NOTE — ED Triage Notes (Signed)
Emesis started today , abd pan when vomiting only , denies diarrhea .  Headache , no Hx migraine

## 2023-01-04 NOTE — ED Notes (Signed)
Up to BR to void

## 2023-01-04 NOTE — ED Provider Notes (Signed)
[facility] Provider Note   CSN: [id number] Arrival date & time: 01/04/23  1526     History  Chief Complaint  Patient presents with   Emesis   HPI Jeanette Dodson is a 62 y.o. female presenting for emesis and abdominal pain.  Started earlier this morning.  Emesis and nausea is started first then later developed abdominal pain after multiple occurrences of emesis.  Emesis is nonbloody and nonbilious.  Denies any bowel changes.  Denies urinary changes.  Last bowel movement was Saturday.  No blood in the stool.  States she cannot keep anything down.  Abdominal pain located just above the umbilicus.  It is nonradiating and feels sharp and intermittent, worse with vomiting.   Emesis Associated symptoms: abdominal pain        Home Medications Prior to Admission medications   Medication Sig Start Date End Date Taking? Authorizing Provider  benzonatate (TESSALON) 100 MG capsule Take 1 capsule (100 mg total) by mouth every 8 (eight) hours. 09/01/21   Theron Arista, PA-C  HYDROcodone-acetaminophen (NORCO/VICODIN) 5-325 MG per tablet Take 1-2 tablets every 6 hours as needed for severe pain 02/22/15   Renne Crigler, PA-C  Levothyroxine Sodium (SYNTHROID PO) Take by mouth.    [provider]  lidocaine (XYLOCAINE) 2 % solution Use as directed 15 mLs in the mouth or throat as needed for mouth pain. 09/01/21   Theron Arista, PA-C  ondansetron (ZOFRAN ODT) 4 MG disintegrating tablet Take 1 tablet (4 mg total) by mouth every 8 (eight) hours as needed for nausea or vomiting. 03/26/21   Tilden Fossa, MD  oseltamivir (TAMIFLU) 75 MG capsule Take 1 capsule (75 mg total) by mouth every 12 (twelve) hours. 11/08/18   Tegeler, Canary Brim, MD  pantoprazole (PROTONIX) 20 MG tablet Take 1 tablet (20 mg total) by mouth daily. 03/26/21   Tilden Fossa, MD  Valsartan (DIOVAN PO) Take by mouth.    [provider]  Vitamin D, Ergocalciferol,  (DRISDOL) 50000 UNITS CAPS capsule Take 50,000 Units by mouth every 7 (seven) days.    [provider]      Allergies    Patient has no known allergies.    Review of Systems   Review of Systems  Gastrointestinal:  Positive for abdominal pain and vomiting.    Physical Exam   Vitals:   01/04/23 1531 01/04/23 1830  BP: (!) 155/107 (!) 156/86  Pulse: 79 71  Resp: 20 20  Temp: 97.8 F (36.6 C)   SpO2: 99% 95%    CONSTITUTIONAL:  well-appearing, NAD NEURO:  Alert and oriented x 3, CN 3-12 grossly intact EYES:  eyes equal and reactive ENT/NECK:  Supple, no stridor  CARDIO:  regular rate and rhythm, appears well-perfused  PULM:  No respiratory distress, CTAB GI/GU:  non-distended, soft, non tender MSK/SPINE:  No gross deformities, no edema, moves all extremities  SKIN:  no rash, atraumatic=  *Additional and/or pertinent findings included in MDM below   ED Results / Procedures / Treatments   Labs (all labs ordered are listed, but only abnormal results are displayed) Labs Reviewed  COMPREHENSIVE METABOLIC PANEL - Abnormal; Notable for the following components:      Result Value   Glucose, Bld 114 (*)    All other components within normal limits  URINALYSIS, ROUTINE W REFLEX MICROSCOPIC - Abnormal; Notable for the following components:   Bilirubin Urine SMALL (*)    Protein, ur 100 (*)  All other components within normal limits  URINALYSIS, MICROSCOPIC (REFLEX) - Abnormal; Notable for the following components:   Bacteria, UA FEW (*)    All other components within normal limits  LIPASE, BLOOD  CBC    EKG None  Radiology No results found.  Procedures Procedures    Medications Ordered in ED Medications  ketorolac (TORADOL) 30 MG/ML injection 15 mg (15 mg Intravenous Given 01/04/23 1658)  ondansetron (ZOFRAN) injection 4 mg (4 mg Intravenous Given 01/04/23 1715)  sodium chloride 0.9 % bolus 1,000 mL (1,000 mLs Intravenous New Bag/Given 01/04/23 1658)     ED Course/ Medical Decision Making/ A&P Clinical Course as of 01/04/23 1857  Mon Jan 04, 2023  1823 Stable NV AP Reassuring exam Labs WNL Symptomatic management and dispo if improving [CC]    Clinical Course User Index [CC] Glyn Ade, MD                             Medical Decision Making Amount and/or Complexity of Data Reviewed Labs: ordered.  Risk Prescription drug management.   Initial Impression and Ddx 62 year old well-appearing female presenting for emesis and abdominal pain.  Exam notable for supraumbilical tenderness but otherwise reassuring.  DDx includes pancreatitis, acute cholecystitis, diverticulitis, nephrolithiasis or pyelonephritis, gastroenteritis. Patient PMH that increases complexity of ED encounter:  none  Interpretation of Diagnostics I independent reviewed and interpreted the labs as followed: No acute derangement   Patient Reassessment and Ultimate Disposition/Management Treated nausea with Zofran.  Treated pain with Toradol and volume resuscitated with normal saline bolus.  After treatment patient states she felt much better.  Low suspicion for intra-abdominal infection given how well-appearing she is with a relatively benign abdominal exam and no acute drainage on the labs.  Suspect it is a viral gastroenteritis or other foodborne illness.  Recommend she continue conservative treatment at home.  Discussed return precautions.  Vital stable discharge.  Patient management required discussion with the following services or consulting groups:  None  Complexity of Problems Addressed Acute complicated illness or Injury  Additional Data Reviewed and Analyzed Further history obtained from: Past medical history and medications listed in the EMR  Patient Encounter Risk Assessment None         Final Clinical Impression(s) / ED Diagnoses Final diagnoses:  Abdominal pain, unspecified abdominal location  Nausea and vomiting, unspecified  vomiting type    Rx / DC Orders ED Discharge Orders     None         Gareth Eagle, PA-C 01/04/23 1857    Glyn Ade, MD 01/08/23 1606

## 2023-01-04 NOTE — ED Notes (Signed)
Pt states she has had n/v/d x several days has some friends that have had the same thing has had some abd pain also

## 2023-01-04 NOTE — Discharge Instructions (Addendum)
Evaluation for your abdominal pain and vomiting were overall reassuring.  Your labs did not reveal any acute concerns.  I am encouraged that you are feeling much better after treatment.  Suspect that this is a viral gastroenteritis or other foodborne illness.  Recommend conservative treatment at home which includes assertive hydration with water and Gatorade.  If you have worsening abdominal pain, more vomiting or diarrhea or blood in the stool or vomit or any other concern please return emergency department further evaluation.  Otherwise recommend you follow-up with your PCP.

## 2023-01-27 ENCOUNTER — Other Ambulatory Visit: Payer: Self-pay

## 2023-01-27 ENCOUNTER — Emergency Department (HOSPITAL_COMMUNITY): Payer: 59

## 2023-01-27 ENCOUNTER — Encounter (HOSPITAL_COMMUNITY): Payer: Self-pay

## 2023-01-27 ENCOUNTER — Emergency Department (HOSPITAL_COMMUNITY)
Admission: EM | Admit: 2023-01-27 | Discharge: 2023-01-27 | Disposition: A | Discharge status: Home / Self Care | Payer: 59 | Attending: Emergency Medicine | Admitting: Emergency Medicine

## 2023-01-27 DIAGNOSIS — E119 Type 2 diabetes mellitus without complications: Secondary | ICD-10-CM | POA: Diagnosis not present

## 2023-01-27 DIAGNOSIS — Z79899 Other long term (current) drug therapy: Secondary | ICD-10-CM | POA: Insufficient documentation

## 2023-01-27 DIAGNOSIS — N132 Hydronephrosis with renal and ureteral calculous obstruction: Secondary | ICD-10-CM | POA: Diagnosis not present

## 2023-01-27 DIAGNOSIS — N1330 Unspecified hydronephrosis: Secondary | ICD-10-CM

## 2023-01-27 DIAGNOSIS — I10 Essential (primary) hypertension: Secondary | ICD-10-CM | POA: Diagnosis not present

## 2023-01-27 DIAGNOSIS — D72829 Elevated white blood cell count, unspecified: Secondary | ICD-10-CM | POA: Diagnosis not present

## 2023-01-27 DIAGNOSIS — R1032 Left lower quadrant pain: Secondary | ICD-10-CM | POA: Diagnosis present

## 2023-01-27 LAB — URINALYSIS, W/ REFLEX TO CULTURE (INFECTION SUSPECTED)
Bacteria, UA: NONE SEEN
Bilirubin Urine: NEGATIVE
Glucose, UA: NEGATIVE mg/dL
Ketones, ur: NEGATIVE mg/dL
Leukocytes,Ua: NEGATIVE
Nitrite: NEGATIVE
Protein, ur: NEGATIVE mg/dL
Specific Gravity, Urine: 1.018 (ref 1.005–1.030)
pH: 7 (ref 5.0–8.0)

## 2023-01-27 LAB — BASIC METABOLIC PANEL
Anion gap: 13 (ref 5–15)
BUN: 12 mg/dL (ref 8–23)
CO2: 19 mmol/L — ABNORMAL LOW (ref 22–32)
Calcium: 8.6 mg/dL — ABNORMAL LOW (ref 8.9–10.3)
Chloride: 106 mmol/L (ref 98–111)
Creatinine, Ser: 1.3 mg/dL — ABNORMAL HIGH (ref 0.44–1.00)
GFR, Estimated: 47 mL/min — ABNORMAL LOW (ref >60)
Glucose, Bld: 137 mg/dL — ABNORMAL HIGH (ref 70–99)
Potassium: 3.3 mmol/L — ABNORMAL LOW (ref 3.5–5.1)
Sodium: 138 mmol/L (ref 135–145)

## 2023-01-27 LAB — CBC
HCT: 43.1 % (ref 36.0–46.0)
Hemoglobin: 14 g/dL (ref 12.0–15.0)
MCH: 28.2 pg (ref 26.0–34.0)
MCHC: 32.5 g/dL (ref 30.0–36.0)
MCV: 86.9 fL (ref 80.0–100.0)
Platelets: 246 10*3/uL (ref 150–400)
RBC: 4.96 MIL/uL (ref 3.87–5.11)
RDW: 13.8 % (ref 11.5–15.5)
WBC: 12.1 10*3/uL — ABNORMAL HIGH (ref 4.0–10.5)
nRBC: 0 % (ref 0.0–0.2)

## 2023-01-27 LAB — LIPASE, BLOOD: Lipase: 21 U/L (ref 11–51)

## 2023-01-27 MED ORDER — IOHEXOL 350 MG/ML SOLN
75.0000 mL | Freq: Once | INTRAVENOUS | Status: AC | PRN
  Administered 2023-01-27: 75 mL via INTRAVENOUS

## 2023-01-27 MED ORDER — KETOROLAC TROMETHAMINE 15 MG/ML IJ SOLN: 15.0000 mg | Freq: Once | INTRAMUSCULAR | Status: DC

## 2023-01-27 MED ORDER — SODIUM CHLORIDE 0.9 % IV BOLUS
1000.0000 mL | Freq: Once | INTRAVENOUS | Status: AC
  Administered 2023-01-27: 1000 mL via INTRAVENOUS

## 2023-01-27 MED ORDER — MORPHINE SULFATE (PF) 4 MG/ML IV SOLN
4.0000 mg | Freq: Once | INTRAVENOUS | Status: AC
  Administered 2023-01-27: 4 mg via INTRAVENOUS
  Filled 2023-01-27: qty 1

## 2023-01-27 MED ORDER — ONDANSETRON HCL 4 MG/2ML IJ SOLN
4.0000 mg | Freq: Once | INTRAMUSCULAR | Status: AC
  Administered 2023-01-27: 4 mg via INTRAVENOUS
  Filled 2023-01-27: qty 2

## 2023-01-27 NOTE — ED Triage Notes (Addendum)
Pt came in via POV d/t Lt flank pain & 6/10 abd pain that started approx 1 hr ago. N/V present, denies any issues urinating. Has recently;y started Oxempic d/t her DM & states she thinks this med could be causing her to vomit.

## 2023-01-27 NOTE — Discharge Instructions (Addendum)
You have been evaluated for your symptoms.  Your pain is likely due to a recently passed kidney stone.  Your symptoms will improve over time.  Please stay hydrated by drinking plenty of fluid.  Return to the ER if you have any concern.  Otherwise you may take over-the-counter Tylenol or ibuprofen as needed for pain.

## 2023-01-27 NOTE — ED Provider Notes (Signed)
[place] EMERGENCY DEPARTMENT AT [facility] Provider Note   CSN: [id number] Arrival date & time: 01/27/23  1058     History  Chief Complaint  Patient presents with   Abdominal Pain   Flank Pain    Jeanette Dodson is a 62 y.o. female.  Patient presents to the emergency department complaining of left-sided flank pain and abdominal pain which began approxi-1 hour prior to arrival.  Patient also endorses nausea and vomiting.  Patient denies dysuria, hematuria, chest pain, shortness of breath, fever.  Patient states that for the past month she has been on Ozempic but has not taken any for 2 weeks due to nausea and vomiting.  She has been having intermittent nausea and vomiting starting the medication.  The flank pain began today.  Pain is rated 6 out of 10 in severity.  Past medical history significant for type II DM, hypertension, thyroid disease  HPI     Home Medications Prior to Admission medications   Medication Sig Start Date End Date Taking? Authorizing Provider  benzonatate (TESSALON) 100 MG capsule Take 1 capsule (100 mg total) by mouth every 8 (eight) hours. 09/01/21   Theron Arista, PA-C  HYDROcodone-acetaminophen (NORCO/VICODIN) 5-325 MG per tablet Take 1-2 tablets every 6 hours as needed for severe pain 02/22/15   Renne Crigler, PA-C  Levothyroxine Sodium (SYNTHROID PO) Take by mouth.    [provider]  lidocaine (XYLOCAINE) 2 % solution Use as directed 15 mLs in the mouth or throat as needed for mouth pain. 09/01/21   Theron Arista, PA-C  ondansetron (ZOFRAN ODT) 4 MG disintegrating tablet Take 1 tablet (4 mg total) by mouth every 8 (eight) hours as needed for nausea or vomiting. 03/26/21   Tilden Fossa, MD  oseltamivir (TAMIFLU) 75 MG capsule Take 1 capsule (75 mg total) by mouth every 12 (twelve) hours. 11/08/18   Tegeler, Canary Brim, MD  pantoprazole (PROTONIX) 20 MG tablet Take 1 tablet (20 mg total) by mouth daily. 03/26/21   Tilden Fossa, MD   Valsartan (DIOVAN PO) Take by mouth.    [provider]  Vitamin D, Ergocalciferol, (DRISDOL) 50000 UNITS CAPS capsule Take 50,000 Units by mouth every 7 (seven) days.    [provider]      Allergies    Patient has no known allergies.    Review of Systems   Review of Systems  Physical Exam Updated Vital Signs BP (!) 164/88   Pulse 78   Temp 98.3 F (36.8 C) (Oral)   Resp (!) 22   SpO2 97%  Physical Exam Vitals and nursing note reviewed.  Constitutional:      General: She is not in acute distress.    Appearance: She is well-developed. She is obese.  HENT:     Head: Normocephalic and atraumatic.  Eyes:     Conjunctiva/sclera: Conjunctivae normal.  Cardiovascular:     Rate and Rhythm: Normal rate and regular rhythm.     Heart sounds: No murmur heard. Pulmonary:     Effort: Pulmonary effort is normal. No respiratory distress.     Breath sounds: Normal breath sounds.  Abdominal:     General: There is no distension.     Palpations: Abdomen is soft.     Tenderness: There is abdominal tenderness in the left lower quadrant. There is left CVA tenderness.  Musculoskeletal:        General: No swelling.     Cervical back: Neck supple.  Skin:  General: Skin is warm and dry.     Capillary Refill: Capillary refill takes less than 2 seconds.  Neurological:     Mental Status: She is alert.  Psychiatric:        Mood and Affect: Mood normal.     ED Results / Procedures / Treatments   Labs (all labs ordered are listed, but only abnormal results are displayed) Labs Reviewed  BASIC METABOLIC PANEL - Abnormal; Notable for the following components:      Result Value   Potassium 3.3 (*)    CO2 19 (*)    Glucose, Bld 137 (*)    Creatinine, Ser 1.30 (*)    Calcium 8.6 (*)    GFR, Estimated 47 (*)    All other components within normal limits  CBC - Abnormal; Notable for the following components:   WBC 12.1 (*)    All other components within normal limits   LIPASE, BLOOD  URINALYSIS, W/ REFLEX TO CULTURE (INFECTION SUSPECTED)    EKG None  Radiology CT ABDOMEN PELVIS W CONTRAST  Result Date: 01/27/2023 CLINICAL DATA:  Acute abdominal pain, nonlocalized. EXAM: CT ABDOMEN AND PELVIS WITH CONTRAST TECHNIQUE: Multidetector CT imaging of the abdomen and pelvis was performed using the standard protocol following bolus administration of intravenous contrast. RADIATION DOSE REDUCTION: This exam was performed according to the departmental dose-optimization program which includes automated exposure control, adjustment of the mA and/or kV according to patient size and/or use of iterative reconstruction technique. CONTRAST:  75mL OMNIPAQUE IOHEXOL 350 MG/ML SOLN COMPARISON:  None Available. FINDINGS: Lower chest: No acute abnormality. Hepatobiliary: No focal liver abnormality is seen. No gallstones, gallbladder wall thickening, or biliary dilatation. Pancreas: Unremarkable. No pancreatic ductal dilatation or surrounding inflammatory changes. Spleen: Normal in size without focal abnormality. Adrenals/Urinary Tract: Adrenal glands are unremarkable. There is moderate left hydroureteronephrosis. Left perinephric fat stranding. There is no ureteral calculus identified. Right kidney and ureter are normal in size. Bladder is unremarkable. Stomach/Bowel: Stomach is within normal limits. Appendix appears normal. No evidence of bowel wall thickening, distention, or inflammatory changes. Vascular/Lymphatic: No significant vascular findings are present. No enlarged abdominal or pelvic lymph nodes. Reproductive: Partial hysterectomy.  No adnexal mass. Other: No abdominal wall hernia or abnormality. No abdominopelvic ascites. 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 No acute osseous abnormality. IMPRESSION: 1. Moderate left hydroureteronephrosis with left perinephric fat stranding. No ureteral calculus is identified. Findings may represent sequela  of recently passed stone or urinary tract infection. Correlation with urinalysis is suggested. 2. No CT evidence of acute appendicitis. 3. No evidence of bowel obstruction or bowel inflammation. 4. 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 Electronically Signed   By: Larose Hires D.O.   On: 01/27/2023 14:16    Procedures Procedures    Medications Ordered in ED Medications  ketorolac (TORADOL) 15 MG/ML injection 15 mg (15 mg Intravenous Not Given 01/27/23 1503)  sodium chloride 0.9 % bolus 1,000 mL (1,000 mLs Intravenous New Bag/Given 01/27/23 1325)  ondansetron (ZOFRAN) injection 4 mg (4 mg Intravenous Given 01/27/23 1326)  morphine (PF) 4 MG/ML injection 4 mg (4 mg Intravenous Given 01/27/23 1332)  iohexol (OMNIPAQUE) 350 MG/ML injection 75 mL (75 mLs Intravenous Contrast Given 01/27/23 1357)    ED Course/ Medical Decision Making/ A&P                             Medical Decision Making Amount and/or Complexity  of Data Reviewed Labs: ordered. Radiology: ordered.  Risk Prescription drug management.   This patient presents to the ED for concern of abdominal pain, this involves an extensive number of treatment options, and is a complaint that carries with it a high risk of complications and morbidity.  The differential diagnosis includes nephrolithiasis, hydronephrosis, pyelonephritis, appendicitis, cholecystitis, colitis, diverticulitis, others   Co morbidities that complicate the patient evaluation  Diabetes, hypertension   Additional history obtained:   External records from outside source obtained and reviewed including emergency notes from April 29.  Patient evaluated that time for nonbloody emesis and abdominal pain.  Symptoms at that time thought to be due to possible viral gastroenteritis or foodborne illness   Lab Tests:  I Ordered, and personally interpreted labs.  The pertinent results include: Creatinine 1.3 (baseline 0.9), WBC 12.1, normal lipase   Imaging  Studies ordered:  I ordered imaging studies including CT abdomen pelvis with contrast I independently visualized and interpreted imaging which showed  1. Moderate left hydroureteronephrosis with left perinephric fat  stranding. No ureteral calculus is identified. Findings may  represent sequela of recently passed stone or urinary tract  infection. Correlation with urinalysis is suggested.  2. No CT evidence of acute appendicitis.  3. No evidence of bowel obstruction or bowel inflammation.  4. 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   I agree with the radiologist interpretation   Cardiac Monitoring: / EKG:  The patient was maintained on a cardiac monitor.  I personally viewed and interpreted the cardiac monitored which showed an underlying rhythm of: Normal sinus rhythm    Problem List / ED Course / Critical interventions / Medication management   I ordered medication including morphine for pain, Zofran for nausea, normal saline for fluid resuscitation Reevaluation of the patient after these medicines showed that the patient improved I have reviewed the patients home medicines and have made adjustments as needed   Test / Admission - Considered:  Patient with left-sided moderate hydroureteronephrosis.  Could be due to recently passed kidney stone, or possibly due to underlying urinary tract infection. Disposition pending results of still to be collected urinalysis. Planning discharge home, with or without antibiotics. Initial presentation was concerning for possible nephrolithiasis, question if patient may have passed stone. No dysuria or hematuria reported. Patient care being transferred to Fayrene Helper, PA-C at shift handoff.          Final Clinical Impression(s) / ED Diagnoses Final diagnoses:  Hydroureteronephrosis    Rx / DC Orders ED Discharge Orders     None         Pamala Duffel 01/27/23 1512    Pricilla Loveless, MD 01/29/23 [phone]

## 2023-01-27 NOTE — ED Provider Notes (Signed)
Acute onset of pain, CT showing possible recently passed stone.  Awaits UA result to determine if she needs abx.  UA resulted shows moderate hemoglobin on urine dipsticks but fortunately no evidence of urinary tract infection.  On reassessment patient reported feeling better.  I felt patient symptoms likely due to recent he passed kidney stones.  At this time she is stable to be discharged home.  She tolerates p.o.  Return precaution given.  BP (!) 168/99   Pulse 80   Temp 98.3 F (36.8 C) (Oral)   Resp (!) 21   SpO2 98%   Results for orders placed or performed during the hospital encounter of 01/27/23  Basic metabolic panel  Result Value Ref Range   Sodium 138 135 - 145 mmol/L   Potassium 3.3 (L) 3.5 - 5.1 mmol/L   Chloride 106 98 - 111 mmol/L   CO2 19 (L) 22 - 32 mmol/L   Glucose, Bld 137 (H) 70 - 99 mg/dL   BUN 12 8 - 23 mg/dL   Creatinine, Ser 1.61 (H) 0.44 - 1.00 mg/dL   Calcium 8.6 (L) 8.9 - 10.3 mg/dL   GFR, Estimated 47 (L) >60 mL/min   Anion gap 13 5 - 15  Urinalysis, w/ Reflex to Culture (Infection Suspected) -Urine, Clean Catch  Result Value Ref Range   Specimen Source URINE, CLEAN CATCH    Color, Urine COLORLESS (A) YELLOW   APPearance CLEAR CLEAR   Specific Gravity, Urine 1.018 1.005 - 1.030   pH 7.0 5.0 - 8.0   Glucose, UA NEGATIVE NEGATIVE mg/dL   Hgb urine dipstick MODERATE (A) NEGATIVE   Bilirubin Urine NEGATIVE NEGATIVE   Ketones, ur NEGATIVE NEGATIVE mg/dL   Protein, ur NEGATIVE NEGATIVE mg/dL   Nitrite NEGATIVE NEGATIVE   Leukocytes,Ua NEGATIVE NEGATIVE   RBC / HPF 0-5 0 - 5 RBC/hpf   WBC, UA 0-5 0 - 5 WBC/hpf   Bacteria, UA NONE SEEN NONE SEEN   Squamous Epithelial / HPF 0-5 0 - 5 /HPF  CBC  Result Value Ref Range   WBC 12.1 (H) 4.0 - 10.5 K/uL   RBC 4.96 3.87 - 5.11 MIL/uL   Hemoglobin 14.0 12.0 - 15.0 g/dL   HCT 09.6 04.5 - 40.9 %   MCV 86.9 80.0 - 100.0 fL   MCH 28.2 26.0 - 34.0 pg   MCHC 32.5 30.0 - 36.0 g/dL   RDW 81.1 91.4 - 78.2 %    Platelets 246 150 - 400 K/uL   nRBC 0.0 0.0 - 0.2 %  Lipase, blood  Result Value Ref Range   Lipase 21 11 - 51 U/L   CT ABDOMEN PELVIS W CONTRAST  Result Date: 01/27/2023 CLINICAL DATA:  Acute abdominal pain, nonlocalized. EXAM: CT ABDOMEN AND PELVIS WITH CONTRAST TECHNIQUE: Multidetector CT imaging of the abdomen and pelvis was performed using the standard protocol following bolus administration of intravenous contrast. RADIATION DOSE REDUCTION: This exam was performed according to the departmental dose-optimization program which includes automated exposure control, adjustment of the mA and/or kV according to patient size and/or use of iterative reconstruction technique. CONTRAST:  75mL OMNIPAQUE IOHEXOL 350 MG/ML SOLN COMPARISON:  None Available. FINDINGS: Lower chest: No acute abnormality. Hepatobiliary: No focal liver abnormality is seen. No gallstones, gallbladder wall thickening, or biliary dilatation. Pancreas: Unremarkable. No pancreatic ductal dilatation or surrounding inflammatory changes. Spleen: Normal in size without focal abnormality. Adrenals/Urinary Tract: Adrenal glands are unremarkable. There is moderate left hydroureteronephrosis. Left perinephric fat stranding. There is no ureteral  calculus identified. Right kidney and ureter are normal in size. Bladder is unremarkable. Stomach/Bowel: Stomach is within normal limits. Appendix appears normal. No evidence of bowel wall thickening, distention, or inflammatory changes. Vascular/Lymphatic: No significant vascular findings are present. No enlarged abdominal or pelvic lymph nodes. Reproductive: Partial hysterectomy.  No adnexal mass. Other: No abdominal wall hernia or abnormality. No abdominopelvic ascites. 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 No acute osseous abnormality. IMPRESSION: 1. Moderate left hydroureteronephrosis with left perinephric fat stranding. No ureteral calculus is identified. Findings  may represent sequela of recently passed stone or urinary tract infection. Correlation with urinalysis is suggested. 2. No CT evidence of acute appendicitis. 3. No evidence of bowel obstruction or bowel inflammation. 4. 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 Electronically Signed   By: Larose Hires D.O.   On: 01/27/2023 14:16      Fayrene Helper, PA-C 01/27/23 1717    Glyn Ade, MD 01/28/23 1455

## 2023-03-15 ENCOUNTER — Other Ambulatory Visit: Payer: Self-pay

## 2023-03-15 ENCOUNTER — Encounter (HOSPITAL_BASED_OUTPATIENT_CLINIC_OR_DEPARTMENT_OTHER): Payer: Self-pay

## 2023-03-15 ENCOUNTER — Emergency Department (HOSPITAL_BASED_OUTPATIENT_CLINIC_OR_DEPARTMENT_OTHER)
Admission: EM | Admit: 2023-03-15 | Discharge: 2023-03-15 | Disposition: A | Discharge status: Home / Self Care | Payer: 59 | Attending: Emergency Medicine | Admitting: Emergency Medicine

## 2023-03-15 ENCOUNTER — Emergency Department (HOSPITAL_BASED_OUTPATIENT_CLINIC_OR_DEPARTMENT_OTHER): Payer: 59

## 2023-03-15 DIAGNOSIS — Z79899 Other long term (current) drug therapy: Secondary | ICD-10-CM | POA: Insufficient documentation

## 2023-03-15 DIAGNOSIS — E039 Hypothyroidism, unspecified: Secondary | ICD-10-CM | POA: Diagnosis not present

## 2023-03-15 DIAGNOSIS — R059 Cough, unspecified: Secondary | ICD-10-CM | POA: Diagnosis present

## 2023-03-15 DIAGNOSIS — I10 Essential (primary) hypertension: Secondary | ICD-10-CM | POA: Insufficient documentation

## 2023-03-15 DIAGNOSIS — U071 COVID-19: Secondary | ICD-10-CM | POA: Diagnosis not present

## 2023-03-15 LAB — BASIC METABOLIC PANEL
Anion gap: 11 (ref 5–15)
BUN: 10 mg/dL (ref 8–23)
CO2: 20 mmol/L — ABNORMAL LOW (ref 22–32)
Calcium: 8.4 mg/dL — ABNORMAL LOW (ref 8.9–10.3)
Chloride: 105 mmol/L (ref 98–111)
Creatinine, Ser: 0.88 mg/dL (ref 0.44–1.00)
GFR, Estimated: >60 mL/min (ref >60)
Glucose, Bld: 141 mg/dL — ABNORMAL HIGH (ref 70–99)
Potassium: 3.5 mmol/L (ref 3.5–5.1)
Sodium: 136 mmol/L (ref 135–145)

## 2023-03-15 LAB — TROPONIN I (HIGH SENSITIVITY): Troponin I (High Sensitivity): 4 ng/L (ref <18)

## 2023-03-15 LAB — SARS CORONAVIRUS 2 BY RT PCR: SARS Coronavirus 2 by RT PCR: POSITIVE — AB

## 2023-03-15 MED ORDER — ACETAMINOPHEN 500 MG PO TABS
1000.0000 mg | ORAL_TABLET | Freq: Once | ORAL | Status: AC
  Administered 2023-03-15: 1000 mg via ORAL
  Filled 2023-03-15: qty 2

## 2023-03-15 MED ORDER — PAXLOVID (300/100) 20 X 150 MG & 10 X 100MG PO TBPK: 3.0000 | ORAL_TABLET | Freq: Two times a day (BID) | ORAL | 0 refills | Status: AC

## 2023-03-15 NOTE — ED Notes (Signed)
Pt came to ER for cough and no feeling well x 4 days states went to family reunion and a family member was sick , pt states that she felt palpitations but she has missed a few days of her thryoid meds

## 2023-03-15 NOTE — ED Provider Notes (Signed)
[place] EMERGENCY DEPARTMENT AT [facility] Provider Note   CSN: [id number] Arrival date & time: 03/15/23  4098     History  Chief Complaint  Patient presents with   Chest Pain   Cough   HPI Jeanette Dodson is a 62 y.o. female with history of hypertension and hypothyroidism presenting for cough and chest pain.  Symptoms started yesterday.  She started having persistent cough, with fever, chills and shortness of breath nausea.  At that time she also started to have "chest heaviness" in the center of her chest.  It is nonradiating, nonexertional nonpleuritic.  Also mention that she has felt intermittent palpitations since yesterday as well.  States she was with her family on July 4.  Mentioned that one of her cousins had a cough at that time as well.  States she did miss a couple doses of her Synthroid due to that she was feeling.  Denies calf tenderness and OCP use.    Chest Pain Associated symptoms: cough   Cough Associated symptoms: chest pain        Home Medications Prior to Admission medications   Medication Sig Start Date End Date Taking? Authorizing Provider  nirmatrelvir & ritonavir (PAXLOVID, 300/100,) 20 x 150 MG & 10 x 100MG  TBPK Take 3 tablets by mouth 2 (two) times daily for 5 days. 03/15/23 03/20/23 Yes Gareth Eagle, PA-C  benzonatate (TESSALON) 100 MG capsule Take 1 capsule (100 mg total) by mouth every 8 (eight) hours. 09/01/21   Theron Arista, PA-C  HYDROcodone-acetaminophen (NORCO/VICODIN) 5-325 MG per tablet Take 1-2 tablets every 6 hours as needed for severe pain 02/22/15   Renne Crigler, PA-C  Levothyroxine Sodium (SYNTHROID PO) Take by mouth.    [provider]  lidocaine (XYLOCAINE) 2 % solution Use as directed 15 mLs in the mouth or throat as needed for mouth pain. 09/01/21   Theron Arista, PA-C  ondansetron (ZOFRAN ODT) 4 MG disintegrating tablet Take 1 tablet (4 mg total) by mouth every 8 (eight) hours as needed for nausea or vomiting.  03/26/21   Tilden Fossa, MD  oseltamivir (TAMIFLU) 75 MG capsule Take 1 capsule (75 mg total) by mouth every 12 (twelve) hours. 11/08/18   Tegeler, Canary Brim, MD  pantoprazole (PROTONIX) 20 MG tablet Take 1 tablet (20 mg total) by mouth daily. 03/26/21   Tilden Fossa, MD  Valsartan (DIOVAN PO) Take by mouth.    [provider]  Vitamin D, Ergocalciferol, (DRISDOL) 50000 UNITS CAPS capsule Take 50,000 Units by mouth every 7 (seven) days.    [provider]      Allergies    Patient has no known allergies.    Review of Systems   Review of Systems  Respiratory:  Positive for cough.   Cardiovascular:  Positive for chest pain.    Physical Exam Updated Vital Signs BP (!) 169/117   Pulse (!) 51   Temp 99 F (37.2 C) (Oral)   Resp 20   Ht 5\' 6"  (1.676 m)   Wt 122.5 kg   SpO2 97%   BMI 43.58 kg/m  Physical Exam Vitals and nursing note reviewed.  HENT:     Head: Normocephalic and atraumatic.     Mouth/Throat:     Mouth: Mucous membranes are moist.  Eyes:     General:        Right eye: No discharge.        Left eye: No discharge.     Conjunctiva/sclera: Conjunctivae normal.  Cardiovascular:     Rate and Rhythm: Normal rate. Rhythm irregular.     Pulses: Normal pulses.     Heart sounds: Normal heart sounds.  Pulmonary:     Effort: Pulmonary effort is normal.     Breath sounds: Normal breath sounds and air entry. No wheezing, rhonchi or rales.  Abdominal:     General: Abdomen is flat.     Palpations: Abdomen is soft.  Skin:    General: Skin is warm and dry.  Neurological:     General: No focal deficit present.  Psychiatric:        Mood and Affect: Mood normal.     ED Results / Procedures / Treatments   Labs (all labs ordered are listed, but only abnormal results are displayed) Labs Reviewed  SARS CORONAVIRUS 2 BY RT PCR - Abnormal; Notable for the following components:      Result Value   SARS Coronavirus 2 by RT PCR POSITIVE (*)    All  other components within normal limits  BASIC METABOLIC PANEL - Abnormal; Notable for the following components:   CO2 20 (*)    Glucose, Bld 141 (*)    Calcium 8.4 (*)    All other components within normal limits  CBC  TROPONIN I (HIGH SENSITIVITY)    EKG EKG Interpretation Date/Time:  Monday March 15 2023 09:38:28 EDT Ventricular Rate:  95 PR Interval:  128 QRS Duration:  89 QT Interval:  365 QTC Calculation: 459 R Axis:   1  Text Interpretation: Sinus tachycardia Ventricular premature complex Consider anterior infarct Confirmed by Rolan Bucco [phone]) on 03/15/2023 11:11:18 AM  Radiology DG Chest 2 View  Result Date: 03/15/2023 CLINICAL DATA:  Provided history: Chest pain, cough. Chest heaviness. Palpitations. Shortness of breath. Nausea. Chills. EXAM: CHEST - 2 VIEW COMPARISON:  Prior chest radiographs 03/26/2021 and earlier. FINDINGS: Heart size within normal limits. No appreciable airspace consolidation or pulmonary edema. No evidence of pleural effusion or pneumothorax. No acute osseous abnormality identified. IMPRESSION: No evidence of active cardiopulmonary disease. Electronically Signed   By: Jackey Loge D.O.   On: 03/15/2023 11:04    Procedures Procedures    Medications Ordered in ED Medications  acetaminophen (TYLENOL) tablet 1,000 mg (1,000 mg Oral Given 03/15/23 1055)    ED Course/ Medical Decision Making/ A&P                             Medical Decision Making Amount and/or Complexity of Data Reviewed Labs: ordered. Radiology: ordered.  Risk OTC drugs. Prescription drug management.   62 year old well-appearing female presenting for cough, chest pain.  Exam was unremarkable.  DDx includes upper respiratory infection, pneumonia, ACS, PE.  Respiratory PCR was positive for COVID.  Otherwise no other acute derangement on labs.  Consider ACS but unlikely given troponin of 4, nonischemic EKG and chest pain had improved after treatment with Tylenol.  Symptoms not  consistent with PE.  Symptoms are consistent with COVID infection.  Sent Paxlovid to her pharmacy.  Advised conservative treatment at home.  Discussed return precautions.  At this time with no suspicion for respiratory distress.  Discussed her elevated blood pressure and advised her to take her blood pressure medicine at home as prescribed and follow-up with her PCP.  Discharged home in good condition. .       Final Clinical Impression(s) / ED Diagnoses Final diagnoses:  COVID    Rx / DC Orders ED  Discharge Orders          Ordered    nirmatrelvir & ritonavir (PAXLOVID, 300/100,) 20 x 150 MG & 10 x 100MG  TBPK  2 times daily        03/15/23 1142              Gareth Eagle, PA-C 03/15/23 1146    Rolan Bucco, MD 03/15/23 1433

## 2023-03-15 NOTE — Discharge Instructions (Addendum)
Evaluation today revealed that you are COVID-positive.  Your symptoms are consistent with this diagnosis.  Recommend you take the Paxlovid and treat your symptoms conservatively at home with rest, hydration, Tylenol and ibuprofen as needed for symptomatic relief.  If you have shortness of breath, worsening chest pain, trouble breathing or any other concerning symptoms please return emerged part for further evaluation.

## 2023-03-15 NOTE — ED Triage Notes (Signed)
C/o chest heaviness & palpitations since yesterday. Also having chills, cough, shortness of breath, nausea.

## 2023-11-22 ENCOUNTER — Emergency Department (HOSPITAL_BASED_OUTPATIENT_CLINIC_OR_DEPARTMENT_OTHER): Admission: EM | Admit: 2023-11-22 | Discharge: 2023-11-22 | Disposition: A | Discharge status: Home / Self Care

## 2023-11-22 ENCOUNTER — Encounter (HOSPITAL_BASED_OUTPATIENT_CLINIC_OR_DEPARTMENT_OTHER): Payer: Self-pay | Admitting: *Deleted

## 2023-11-22 ENCOUNTER — Other Ambulatory Visit: Payer: Self-pay

## 2023-11-22 ENCOUNTER — Emergency Department (HOSPITAL_BASED_OUTPATIENT_CLINIC_OR_DEPARTMENT_OTHER)

## 2023-11-22 DIAGNOSIS — I10 Essential (primary) hypertension: Secondary | ICD-10-CM | POA: Insufficient documentation

## 2023-11-22 DIAGNOSIS — R519 Headache, unspecified: Secondary | ICD-10-CM | POA: Diagnosis not present

## 2023-11-22 DIAGNOSIS — Z79899 Other long term (current) drug therapy: Secondary | ICD-10-CM | POA: Insufficient documentation

## 2023-11-22 DIAGNOSIS — M542 Cervicalgia: Secondary | ICD-10-CM | POA: Diagnosis present

## 2023-11-22 DIAGNOSIS — Y92410 Unspecified street and highway as the place of occurrence of the external cause: Secondary | ICD-10-CM | POA: Diagnosis not present

## 2023-11-22 MED ORDER — ACETAMINOPHEN 500 MG PO TABS
1000.0000 mg | ORAL_TABLET | Freq: Once | ORAL | Status: AC
  Administered 2023-11-22: 1000 mg via ORAL
  Filled 2023-11-22: qty 2

## 2023-11-22 MED ORDER — IBUPROFEN 800 MG PO TABS
800.0000 mg | ORAL_TABLET | Freq: Once | ORAL | Status: AC
  Administered 2023-11-22: 800 mg via ORAL
  Filled 2023-11-22: qty 1
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
## 2024-09-08 ENCOUNTER — Encounter (HOSPITAL_BASED_OUTPATIENT_CLINIC_OR_DEPARTMENT_OTHER): Payer: Self-pay

## 2024-09-08 ENCOUNTER — Emergency Department (HOSPITAL_BASED_OUTPATIENT_CLINIC_OR_DEPARTMENT_OTHER)

## 2024-09-08 ENCOUNTER — Other Ambulatory Visit (HOSPITAL_BASED_OUTPATIENT_CLINIC_OR_DEPARTMENT_OTHER): Payer: Self-pay

## 2024-09-08 ENCOUNTER — Emergency Department (HOSPITAL_BASED_OUTPATIENT_CLINIC_OR_DEPARTMENT_OTHER)
Admission: EM | Admit: 2024-09-08 | Discharge: 2024-09-08 | Disposition: A | Discharge status: Home / Self Care | Source: Home / Self Care | Attending: Emergency Medicine | Admitting: Emergency Medicine

## 2024-09-08 ENCOUNTER — Other Ambulatory Visit: Payer: Self-pay

## 2024-09-08 DIAGNOSIS — Z79899 Other long term (current) drug therapy: Secondary | ICD-10-CM | POA: Diagnosis not present

## 2024-09-08 DIAGNOSIS — J111 Influenza due to unidentified influenza virus with other respiratory manifestations: Secondary | ICD-10-CM | POA: Diagnosis not present

## 2024-09-08 DIAGNOSIS — I10 Essential (primary) hypertension: Secondary | ICD-10-CM | POA: Insufficient documentation

## 2024-09-08 DIAGNOSIS — R0600 Dyspnea, unspecified: Secondary | ICD-10-CM

## 2024-09-08 DIAGNOSIS — R059 Cough, unspecified: Secondary | ICD-10-CM | POA: Diagnosis present

## 2024-09-08 LAB — CBC WITH DIFFERENTIAL/PLATELET
Abs Immature Granulocytes: 0.02 K/uL (ref 0.00–0.07)
Basophils Absolute: 0 K/uL (ref 0.0–0.1)
Basophils Relative: 0 %
Eosinophils Absolute: 0.1 K/uL (ref 0.0–0.5)
Eosinophils Relative: 1 %
HCT: 40.5 % (ref 36.0–46.0)
Hemoglobin: 13.5 g/dL (ref 12.0–15.0)
Immature Granulocytes: 0 %
Lymphocytes Relative: 27 %
Lymphs Abs: 2.6 K/uL (ref 0.7–4.0)
MCH: 28.4 pg (ref 26.0–34.0)
MCHC: 33.3 g/dL (ref 30.0–36.0)
MCV: 85.3 fL (ref 80.0–100.0)
Monocytes Absolute: 0.7 K/uL (ref 0.1–1.0)
Monocytes Relative: 8 %
Neutro Abs: 6.1 K/uL (ref 1.7–7.7)
Neutrophils Relative %: 64 %
Platelets: 229 K/uL (ref 150–400)
RBC: 4.75 MIL/uL (ref 3.87–5.11)
RDW: 14 % (ref 11.5–15.5)
WBC: 9.6 K/uL (ref 4.0–10.5)
nRBC: 0 % (ref 0.0–0.2)

## 2024-09-08 LAB — PRO BRAIN NATRIURETIC PEPTIDE: Pro Brain Natriuretic Peptide: <50 pg/mL

## 2024-09-08 LAB — COMPREHENSIVE METABOLIC PANEL WITH GFR
ALT: 21 U/L (ref 0–44)
AST: 23 U/L (ref 15–41)
Albumin: 3.8 g/dL (ref 3.5–5.0)
Alkaline Phosphatase: 72 U/L (ref 38–126)
Anion gap: 12 (ref 5–15)
BUN: 14 mg/dL (ref 8–23)
CO2: 24 mmol/L (ref 22–32)
Calcium: 9.1 mg/dL (ref 8.9–10.3)
Chloride: 104 mmol/L (ref 98–111)
Creatinine, Ser: 0.8 mg/dL (ref 0.44–1.00)
GFR, Estimated: >60 mL/min
Glucose, Bld: 138 mg/dL — ABNORMAL HIGH (ref 70–99)
Potassium: 3.8 mmol/L (ref 3.5–5.1)
Sodium: 140 mmol/L (ref 135–145)
Total Bilirubin: 0.6 mg/dL (ref 0.0–1.2)
Total Protein: 6.8 g/dL (ref 6.5–8.1)

## 2024-09-08 LAB — TROPONIN T, HIGH SENSITIVITY: Troponin T High Sensitivity: <15 ng/L (ref 0–19)

## 2024-09-08 MED ORDER — PROMETHAZINE-DM 6.25-15 MG/5ML PO SYRP
5.0000 mL | ORAL_SOLUTION | Freq: Four times a day (QID) | ORAL | 0 refills | Status: AC | PRN
  Filled 2024-09-08: qty 118, 6d supply, fill #0

## 2024-09-08 NOTE — ED Triage Notes (Addendum)
 Pt reports woke up coughing, chills chest and throat burning, body aches. Congestion in chest and feels SOB. Dry cough. Episode of diarrhea

## 2024-09-08 NOTE — ED Notes (Signed)
 Reviewed discharge instructions, follow up and medications with pt. Pt stable and afebrile at discharge. Ambulatory at discharge

## 2024-09-08 NOTE — ED Provider Notes (Signed)
 " [place] [facility] AT [facility] Provider Note   CSN: [id number] Arrival date & time: 09/08/24  1001     Patient presents with: flu like symptoms   Jeanette Dodson is a 64 y.o. female.   HPI     64 year old female with a history of hypertension, thyroid disease presents with concern for shortness of breath, cough, congestion, fatigue, body aches.   She reports she was in her normal state of health yesterday, when she woke up this morning she woke up with cough, chills, sore throat with severe throat burning, chest pain with cough, shortness of breath and chest burning.  Reports sinus congestion and sensation of chest congestion.  The cough is dry.  No known fevers.  Denies nausea, vomiting. One episode diarrhea.  She does have sensation of ear fullness.  No leg pain or swelling, no recent travel or immobilization, no recent surgeries, no history of DVT or PE.    Past Medical History:  Diagnosis Date   Back pain    Hypertension    Neck pain    Thyroid disease      Prior to Admission medications  Medication Sig Start Date End Date Taking? Authorizing Provider  promethazine-dextromethorphan (PROMETHAZINE-DM) 6.25-15 MG/5ML syrup Take 5 mLs by mouth 4 (four) times daily as needed for cough. 09/08/24  Yes Dreama Longs, MD  UBRELVY 100 MG TABS Take by mouth. 09/04/24  Yes [provider]  benzonatate  (TESSALON ) 100 MG capsule Take 1 capsule (100 mg total) by mouth every 8 (eight) hours. 09/01/21   Emelia Sluder, PA-C  HYDROcodone -acetaminophen  (NORCO/VICODIN) 5-325 MG per tablet Take 1-2 tablets every 6 hours as needed for severe pain 02/22/15   Geiple, Joshua, PA-C  Levothyroxine Sodium (SYNTHROID PO) Take by mouth.    [provider]  lidocaine  (XYLOCAINE ) 2 % solution Use as directed 15 mLs in the mouth or throat as needed for mouth pain. 09/01/21   Emelia Sluder, PA-C  ondansetron  (ZOFRAN  ODT) 4 MG disintegrating tablet Take 1 tablet (4  mg total) by mouth every 8 (eight) hours as needed for nausea or vomiting. 03/26/21   Griselda Norris, MD  oseltamivir  (TAMIFLU ) 75 MG capsule Take 1 capsule (75 mg total) by mouth every 12 (twelve) hours. 11/08/18   Tegeler, Lonni PARAS, MD  pantoprazole  (PROTONIX ) 20 MG tablet Take 1 tablet (20 mg total) by mouth daily. 03/26/21   Griselda Norris, MD  Valsartan (DIOVAN PO) Take by mouth.    [provider]  valsartan-hydrochlorothiazide (DIOVAN-HCT) 160-12.5 MG tablet Take 1 tablet by mouth daily.    [provider]  Vitamin D, Ergocalciferol, (DRISDOL) 50000 UNITS CAPS capsule Take 50,000 Units by mouth every 7 (seven) days.    [provider]    Allergies: Patient has no known allergies.    Review of Systems  Updated Vital Signs BP (!) 159/88   Pulse 86   Temp 98.4 F (36.9 C)   Resp 17   Wt 117.9 kg   SpO2 96%   BMI 41.97 kg/m   Physical Exam Vitals and nursing note reviewed.  Constitutional:      General: She is not in acute distress.    Appearance: She is well-developed. She is not diaphoretic.  HENT:     Head: Normocephalic and atraumatic.  Eyes:     Conjunctiva/sclera: Conjunctivae normal.  Cardiovascular:     Rate and Rhythm: Normal rate and regular rhythm.     Heart sounds: Normal heart sounds. No  murmur heard.    No friction rub. No gallop.  Pulmonary:     Effort: Pulmonary effort is normal. No respiratory distress.     Breath sounds: Normal breath sounds. No wheezing or rales.  Abdominal:     General: There is no distension.     Palpations: Abdomen is soft.     Tenderness: There is no abdominal tenderness. There is no guarding.  Musculoskeletal:        General: No tenderness.     Cervical back: Normal range of motion.  Skin:    General: Skin is warm and dry.     Findings: No erythema or rash.  Neurological:     Mental Status: She is alert and oriented to person, place, and time.     (all labs ordered are listed, but only  abnormal results are displayed) Labs Reviewed  COMPREHENSIVE METABOLIC PANEL WITH GFR - Abnormal; Notable for the following components:      Result Value   Glucose, Bld 138 (*)    All other components within normal limits  CBC WITH DIFFERENTIAL/PLATELET  PRO BRAIN NATRIURETIC PEPTIDE  TROPONIN T, HIGH SENSITIVITY    EKG: EKG Interpretation Date/Time:  Friday September 08 2024 10:32:07 EST Ventricular Rate:  82 PR Interval:  136 QRS Duration:  95 QT Interval:  395 QTC Calculation: 462 R Axis:   -45  Text Interpretation: Sinus rhythm Ventricular premature complex Inferior infarct, old Consider anterior infarct No significant change since last tracing Confirmed by Dreama Longs ([id number]) on 09/08/2024 11:18:43 AM  Radiology: [facility] Chest 2 View Result Date: 09/08/2024 CLINICAL DATA:  Shortness of breath.  Cough.  Body aches. EXAM: CHEST - 2 VIEW COMPARISON:  03/15/2023 FINDINGS: Cardiomediastinal silhouette and pulmonary vasculature are within normal limits. Lungs are clear. ACDF changes seen in the lower cervical spine. IMPRESSION: No acute cardiopulmonary process. Electronically Signed   By: Aliene Lloyd M.D.   On: 09/08/2024 12:10     Procedures   Medications Ordered in the ED - No data to display                                  Medical Decision Making Amount and/or Complexity of Data Reviewed Labs: ordered. Radiology: ordered.  Risk Prescription drug management.   64 year old female with a history of hypertension, thyroid disease presents with concern for shortness of breath, cough, congestion, fatigue, body aches.  History of combination of symptoms including cough, chills, body aches, sore throat, episode of diarrhea, and congestion is most consistent with influenza or a flulike viral illness.  Given her dyspnea, which carries a differential of pneumonia, pneumothorax, pulmonary edema, ACS, myocarditis, PE, will obtain other studies.  EKG was completed and personally  evaluated interpreted by me show no evidence of acute ST elevation, nor pericarditis.  It shows a normal sinus rhythm.  Chest x-ray was completed and personally about interpreted by me shows no evidence of pneumonia, pulmonary edema of pneumothorax.    Labs completed and personally by and interpreted by me show no clinical he significant anemia, no clinically significant electrolyte abnormalities, troponin negative, proBNP WNL.  Low clinical suspicion for PE in the setting of no other risk factors, no hypoxia or tachycardia.  Suspect likely influenza.  Discussed the possibility of Tamiflu , risks and benefits, and agreed to use other supportive care.  Discussed the shortage at this time of influenza testing, but given combination of symptoms feel it  is clinically likely and recommend  continued supportive care and strict return precautions.  Will give rx for cough medication.  Patient discharged in stable condition with understanding of reasons to return.        Final diagnoses:  Influenza-like illness  Dyspnea, unspecified type    ED Discharge Orders          Ordered    promethazine-dextromethorphan (PROMETHAZINE-DM) 6.25-15 MG/5ML syrup  4 times daily PRN        09/08/24 1214               Dreama Longs, MD 09/08/24 2112  "
